# Patient Record
Sex: Female | Born: 1960 | Race: Black or African American | Hispanic: No | Marital: Married | Smoking: Never smoker
Health system: Southern US, Community
[De-identification: ages and names within clinical notes are randomized; demographics above are authoritative.]

## PROBLEM LIST (undated history)

## (undated) DIAGNOSIS — I10 Essential (primary) hypertension: Secondary | ICD-10-CM

## (undated) HISTORY — DX: Essential (primary) hypertension: I10

## (undated) HISTORY — PX: TUBAL LIGATION: SHX77

---

## 2013-09-24 ENCOUNTER — Encounter (INDEPENDENT_AMBULATORY_CARE_PROVIDER_SITE_OTHER): Payer: Self-pay | Admitting: Obstetrics & Gynecology

## 2013-10-01 ENCOUNTER — Encounter: Payer: Self-pay | Admitting: Obstetrics & Gynecology

## 2013-10-01 ENCOUNTER — Ambulatory Visit (INDEPENDENT_AMBULATORY_CARE_PROVIDER_SITE_OTHER): Payer: Self-pay | Admitting: Obstetrics & Gynecology

## 2013-10-01 VITALS — BP 145/90 | HR 85 | Temp 97.0°F | Ht 65.0 in | Wt 265.0 lb

## 2013-10-01 DIAGNOSIS — L910 Hypertrophic scar: Secondary | ICD-10-CM

## 2013-10-01 DIAGNOSIS — Z01419 Encounter for gynecological examination (general) (routine) without abnormal findings: Secondary | ICD-10-CM

## 2013-10-01 NOTE — Progress Notes (Signed)
Patient ID: Kristine Kennedy, female   DOB: March 29, 1961, 52 y.o.   MRN: [id number] Subjective:     Kristine Kennedy is a 52 y.o.[id number] female here for a routine exam.  Current complaints: painful lesion on left breast and left side of back at bra line.  Pt reports that the lesion on her breast began as a pimple.  She reports that it is painful she does note that her bra rubs this area and makes the sx worse.  She has other lesions like this on other areas of her body but, they are not as large or painful.         Gynecologic History Patient's last menstrual period was 08/23/2013. Contraception: tubal ligation Last Pap: >20years prev. Results were: normal Last mammogram: unsure if she ever had one.   Obstetric History OB History  Gravida Para Term Preterm AB SAB TAB Ectopic Multiple Living  5 5 5  0 0 0 0 0 0 5    # Outcome Date GA Lbr Len/2nd Weight Sex Delivery Anes PTL Lv  5 TRM      SVD     4 TRM      SVD     3 TRM      SVD     2 TRM      SVD     1 TRM      SVD          The following portions of the patient's history were reviewed and updated as appropriate: allergies, current medications, past family history, past medical history, past social history, past surgical history and problem list.  Review of Systems Pertinent items are noted in HPI.    Objective:    BP 145/90  Pulse 85  Temp(Src) 97 F (36.1 C) (Oral)  Ht 5\' 5"  (1.651 m)  Wt 265 lb (120.203 kg)  BMI 44.1 kg/m2  LMP 08/23/2013  General Appearance:    Alert, cooperative, no distress, appears stated age  Head:    Normocephalic, without obvious abnormality, atraumatic              Neck:   Supple, symmetrical, trachea midline, no adenopathy;    thyroid:  no enlargement/tenderness/nodules; no carotid   bruit or JVD  Back:     Symmetric, no curvature, ROM normal, no CVA tenderness  Lungs:     Clear to auscultation bilaterally, respirations unlabored  Chest Wall:    No tenderness or deformity   Heart:    Regular rate and rhythm, S1 and S2 normal, no murmur, rub   or gallop  Breast Exam:    No tenderness, masses, or nipple abnormality on left breast upper outer quad there is a 5x2 cm keloiod that is very tender topalpation  Abdomen:     Soft, non-tender, bowel sounds active all four quadrants,    no masses, no organomegaly  Genitalia:    Normal female without lesion, discharge or tenderness; EGBUS: no lesions; cervix: no CMT; uterus: small mobile;  Adnexa without masses     Extremities:   Extremities normal, atraumatic, no cyanosis or edema  Pulses:   2+ and symmetric all extremities  Skin:   There is a pedunculated keloid on the back left side at the bra line- the base is ~2-3cm  Lymph nodes:   Cervical, supraclavicular, and axillary nodes normal         Assessment:    Healthy female exam. stressed to pt the importance of routine breast and cervical cancer screening  Keloids x2 (breast and back on left side)   Plan:    Mammogram ordered.   F/u 1 year F/u PAP referral to [facility] clinic to manage keloid (if they are unable to manage pt will call [facility]YN [facility] and I will have Kenalog ordered to inject)

## 2013-10-01 NOTE — Patient Instructions (Addendum)
Mammogram A mammogram is an X-ray test to find changes in a woman's breast. You should get a mammogram if:  You are over 53 years of age.   You have risk factors.   Your doctor recommends that you have one.  BEFORE THE TEST  Do not schedule the test the week before your period, especially if your breasts are sore during this time.  On the day of your mammogram:  Wash your breasts and armpits well. After washing, do not put on any deodorant or talcum powder on until after your test.   Eat and drink as you usually do.   Take your medicines as usual.   If you are diabetic and take insulin, make sure you:   Eat before coming for your test.   Take your insulin as usual.   If you cannot keep your appointment, call before the appointment to cancel. Schedule another appointment.  TEST  You will need to undress from the waist up. You will put on a hospital gown.   Your breast will be put on the mammogram machine, and it will press firmly on your breast with a piece of plastic called a compression paddle. This will make your breast flatter so that the machine can X-ray all parts of your breast.   Both breasts will be X-rayed. Each breast will be X-rayed from above and from the side. An X-ray might need to be taken again if the picture is not good enough.   The mammogram will last about 15 to 30 minutes.  AFTER THE TEST Finding out the results of your test Ask when your test results will be ready. Make sure you get your test results. Document Released: 03/14/2009 Document Revised: 12/05/2011 Document Reviewed: 03/14/2009 [facility] Patient Information 2012 [place], [place].Mammogram Tips Healthy women should begin getting mammograms every year or two once they reach age 38, and once a year when they reach age 3. Here are tips:  Find an experienced, high-volume center with accomplished radiologists. You can ask for their credentials.  Ask to see the certificate showing the center is  approved by the U.S. Food and Drug Administration.  Use the same center regularly, so it is easier to compare your new mammograms with your old ones.  Bring a list of places you have had mammograms, dates, biopsies or other breast treatments. Bring old mammograms with you or have them sent to your primary caregiver.  Describe any breast problems to your caregiver or the person doing the mammogram. Be ready to give past surgeries, birth control pills, hormone use, breast implants, growths, moles, breast scars and family or personal history of breast cancer.  Call your doctor or center to check on the mammogram if you hear nothing within 10 days. Do not assume everything was normal.  To protect your privacy, the mammogram results cannot be given over the phone or to anyone but you.  Radiation from a mammogram is very low and does not pose a radiation risk.  Mammograms can detect breast problems other than breast cancer.  You may be asked stand or sit in front of the X-ray machine.  Two small plastic or glass plates are placed around the breast when taking the X-ray.  If you are menstruating, schedule your mammogram a week after your menstrual period.  Do not wear deodorants, powder or perfume when getting a mammogram.  Wash your breasts and under your arms before getting a mammogram.  Wear cloths that are easy for you to  undress and dress.  Arrive at the center at least 15 minutes before the mammogram is scheduled.  There may be slight discomfort during the mammogram, but it goes away shortly after the test.  Try to relax as much as possible during the mammogram.  Talk to your caregiver if you do not understand the results of the mammogram.  Follow the recommendations of your caregiver regarding further tests and treatments if needed.  Get a second opinion if you are concerned or question the results of the mammogram, further tests or treatment if needed.  Continue with monthly  self-breast exams and yearly caregiver exams even if the mammogram is normal.  Your caregiver may recommend getting a mammogram before age 17 and more often if you are at high risk for developing breast cancer. Document Released: 04/03/2006 Document Revised: 03/09/2012 Document Reviewed: 12/09/2008 [facility] Patient Information 2014 [place], [place].

## 2013-10-01 NOTE — Progress Notes (Signed)
Mammogram scholarship application completed.  Will fax to radiology.  Referral form for [facility] completed.  Will fax to [facility].  New patient packet given to patient.  Instructed patient to complete and mail to [facility] in envelope provided ASAP.  Will call patient once appointment is scheduled. Patient states understanding.

## 2013-10-18 ENCOUNTER — Ambulatory Visit: Payer: Self-pay

## 2013-10-22 ENCOUNTER — Ambulatory Visit: Payer: Self-pay

## 2013-10-22 ENCOUNTER — Encounter: Payer: Self-pay | Admitting: Family Medicine

## 2013-10-22 ENCOUNTER — Ambulatory Visit (INDEPENDENT_AMBULATORY_CARE_PROVIDER_SITE_OTHER): Payer: Self-pay | Admitting: Family Medicine

## 2013-10-22 VITALS — BP 153/98 | HR 97 | Temp 98.3°F | Ht 66.0 in | Wt 265.0 lb

## 2013-10-22 DIAGNOSIS — L910 Hypertrophic scar: Secondary | ICD-10-CM | POA: Insufficient documentation

## 2013-10-22 DIAGNOSIS — I10 Essential (primary) hypertension: Secondary | ICD-10-CM

## 2013-10-22 LAB — BASIC METABOLIC PANEL
BUN: 10 mg/dL (ref 6–23)
CO2: 27 mEq/L (ref 19–32)
Calcium: 9 mg/dL (ref 8.4–10.5)
Creat: 0.64 mg/dL (ref 0.50–1.10)
Glucose, Bld: 72 mg/dL (ref 70–99)

## 2013-10-22 LAB — LIPID PANEL
Cholesterol: 140 mg/dL (ref 0–200)
LDL Cholesterol: 80 mg/dL (ref 0–99)
Total CHOL/HDL Ratio: 2.7 Ratio

## 2013-10-22 MED ORDER — HYDROCHLOROTHIAZIDE 25 MG PO TABS: 25.0000 mg | ORAL_TABLET | Freq: Every day | ORAL | Status: DC

## 2013-10-22 NOTE — Progress Notes (Signed)
  Subjective:    Patient ID: Kristine Kennedy, female    DOB: 05-07-1961, 52 y.o.   MRN: [id number]  HPI Patient complaining of skin growths on her breast and back. They started out looking like a "white-head" and have been gradually enlarging over the past 2 years. There is a sharp stabbing pain in them when they are touched or sometimes spontaneously. She has tried to pop them but that did not help and was painful. She had one similar lesion that was drained and packed on her thigh many years ago. It has scarred over but not formed a large growth as these two have. She did not get scars from having her ears peirced. She does not report easily scarring otherwise but says she hasn't had many injuries to judge.   Review of Systems  Constitutional: Negative for fever and unexpected weight change.  HENT: Negative for hearing loss, mouth sores, tinnitus, trouble swallowing and voice change.   Eyes: Positive for visual disturbance.  Respiratory: Negative for cough and shortness of breath.   Cardiovascular: Negative for chest pain, palpitations and leg swelling.  Gastrointestinal: Negative for nausea, vomiting, abdominal pain, diarrhea, constipation and blood in stool.  Endocrine: Negative for polydipsia and polyuria.  Genitourinary: Negative for dysuria and hematuria.  Skin: Negative for rash and wound.       2 growths and 2 other dark scars  Neurological: Negative for dizziness, syncope, weakness, numbness and headaches.  Psychiatric/Behavioral: Negative for dysphoric mood. The patient is not nervous/anxious.   All other systems reviewed and are negative.       Objective:   Physical Exam  Nursing note and vitals reviewed. Constitutional: She is oriented to person, place, and time. She appears well-developed and well-nourished. No distress.  HENT:  Head: Normocephalic and atraumatic.  Right Ear: External ear normal.  Left Ear: External ear normal.  Nose: Nose normal.  Eyes:  Conjunctivae and EOM are normal. Right eye exhibits no discharge. Left eye exhibits no discharge. No scleral icterus.  Cardiovascular: Normal rate, regular rhythm, normal heart sounds and intact distal pulses.   No murmur heard. Pulmonary/Chest: Effort normal and breath sounds normal. She has no wheezes. She has no rales.  Abdominal: Soft. She exhibits no distension. There is no tenderness.  Musculoskeletal: She exhibits no edema.  Neurological: She is alert and oriented to person, place, and time. Coordination normal.  Skin: Skin is warm and dry. She is not diaphoretic. No cyanosis. Nails show no clubbing.     Psychiatric: She has a normal mood and affect. Her behavior is normal. Judgment and thought content normal.          Assessment & Plan:  See problem list

## 2013-10-22 NOTE — Assessment & Plan Note (Signed)
Patient complaining of 2 years of enlarging growths around bra line, 1 on L breast (~2x6cm)and 1 on back (~3x3cm), look like keloids (started as cyst and grow with irritation), painful and tender - advised patient that excision often results in return of even larger lesion - options given were intralesional steroid injections, serially in office or dermatology referral for likely excision with steroids - patient will think about options and make appointment for steroid injection if she decides to go that way

## 2013-10-22 NOTE — Patient Instructions (Signed)
Thank you for coming in today. I am sorry we do not have a great treatment to fix your keloid quickly. I do think that the safest and most effective thing would be to have steroid injections or to see a dermatologist for more definitive therapy.   For your high blood pressure, I would like to start a medication called  Hydrochlorothiazide. It has few side effects but sometimes can make people urinate more often or feel dizzy. These usually go away after the first few weeks. I would like to see you back next month to see how this is going. We will also check some lab work today to see how your cholesterol, blood sugar, and kidney function are doing. If there is anything wrong we will call you, otherwise I will send a letter.  Thank you!  Dr. Richarda Blade

## 2013-10-22 NOTE — Assessment & Plan Note (Signed)
New diagnosis today, multiple blood pressure readings in the 140s and 150s systolic, 2 documented here, many more measured at patient's work-place - check bmet - start hctz 25 - check lipid panel and A1c - follow up in 1 month for recheck and to assess side effects

## 2013-10-23 ENCOUNTER — Encounter: Payer: Self-pay | Admitting: Family Medicine

## 2013-11-19 ENCOUNTER — Encounter: Payer: Self-pay | Admitting: Family Medicine

## 2013-11-19 ENCOUNTER — Ambulatory Visit (INDEPENDENT_AMBULATORY_CARE_PROVIDER_SITE_OTHER): Payer: Self-pay | Admitting: Family Medicine

## 2013-11-19 VITALS — BP 140/82 | HR 86 | Temp 99.4°F | Ht 66.0 in | Wt 271.0 lb

## 2013-11-19 DIAGNOSIS — Z23 Encounter for immunization: Secondary | ICD-10-CM

## 2013-11-19 DIAGNOSIS — I10 Essential (primary) hypertension: Secondary | ICD-10-CM

## 2013-11-19 DIAGNOSIS — Z0000 Encounter for general adult medical examination without abnormal findings: Secondary | ICD-10-CM | POA: Insufficient documentation

## 2013-11-19 MED ORDER — HYDROCHLOROTHIAZIDE 50 MG PO TABS: 50.0000 mg | ORAL_TABLET | Freq: Every day | ORAL | Status: DC

## 2013-11-19 NOTE — Assessment & Plan Note (Signed)
Pt unsure of last Tdap, not recent - declines flu shot - give tdap today - give info for colon ca screening and mammogram

## 2013-11-19 NOTE — Assessment & Plan Note (Signed)
Improved control, 140/82 here but up to 160 at patients work - increase HCTZ to 50mg  daily - patient to continue home checks and return in 1 month if values remain >140, otherwise f/u in 6 months

## 2013-11-19 NOTE — Progress Notes (Signed)
  Subjective:    Patient ID: Kristine Kennedy, female    DOB: [birth year], 52 y.o.   MRN: [id number]  Hypertension Associated symptoms include headaches. Pertinent negatives include no chest pain or shortness of breath.   CHRONIC HYPERTENSION  Disease Monitoring  Blood pressure range: 140-160/80-90  Chest pain: no   Dyspnea: no   Claudication: no   Medication compliance: yes  Medication Side Effects  Lightheadedness: no   Urinary frequency: no   Edema: no   Impotence: no   Preventitive Healthcare:  Exercise: no   Diet Pattern: increased veggies  Salt Restriction: sometimes     Review of Systems  Respiratory: Negative for shortness of breath.   Cardiovascular: Negative for chest pain and leg swelling.  Genitourinary: Negative for urgency and frequency.  Neurological: Positive for headaches.  All other systems reviewed and are negative.       Objective:   Physical Exam  Nursing note and vitals reviewed. Constitutional: She is oriented to person, place, and time. She appears well-developed and well-nourished. No distress.  HENT:  Head: Normocephalic and atraumatic.  Right Ear: External ear normal.  Left Ear: External ear normal.  Nose: Nose normal.  Eyes: Conjunctivae are normal. Right eye exhibits no discharge. Left eye exhibits no discharge. No scleral icterus.  Cardiovascular: Normal rate, regular rhythm, normal heart sounds and intact distal pulses.   No murmur heard. Pulmonary/Chest: Effort normal and breath sounds normal. No respiratory distress. She has no wheezes.  Abdominal: Soft. She exhibits no distension.  Neurological: She is alert and oriented to person, place, and time.  Skin: Skin is warm and dry. No rash noted. She is not diaphoretic.  Psychiatric: She has a normal mood and affect. Her behavior is normal.          Assessment & Plan:

## 2013-11-19 NOTE — Patient Instructions (Signed)
Thank you for coming in today. I am increasing your blood pressure medication to improve out control of your hypertension. Please keep taking your blood pressure at work and if you keep running high come back to see me next month, otherwise come back in 6 months or whenever you decide you want to have your mass injected.  Thank you,  Dr. Richarda Blade  Hypertension Hypertension is another name for high blood pressure. High blood pressure may mean that your heart needs to work harder to pump blood. Blood pressure consists of two numbers, which includes a higher number over a lower number (example: 110/72). HOME CARE   Make lifestyle changes as told by your doctor. This may include weight loss and exercise.  Take your blood pressure medicine every day.  Limit how much salt you use.  Stop smoking if you smoke.  Do not use drugs.  Talk to your doctor if you are using decongestants or birth control pills. These medicines might make blood pressure higher.  Females should not drink more than 1 alcoholic drink per day. Males should not drink more than 2 alcoholic drinks per day.  See your doctor as told. GET HELP RIGHT AWAY IF:   You have a blood pressure reading with a top number of 180 or higher.  You get a very bad headache.  You get blurred or changing vision.  You feel confused.  You feel weak, numb, or faint.  You get chest or belly (abdominal) pain.  You throw up (vomit).  You cannot breathe very well. MAKE SURE YOU:   Understand these instructions.  Will watch your condition.  Will get help right away if you are not doing well or get worse. Document Released: 06/03/2008 Document Revised: 03/09/2012 Document Reviewed: 06/03/2008 [facility] Patient Information 2014 [place], [place].

## 2014-01-21 ENCOUNTER — Encounter: Payer: Self-pay | Admitting: Obstetrics & Gynecology

## 2014-01-21 ENCOUNTER — Ambulatory Visit (INDEPENDENT_AMBULATORY_CARE_PROVIDER_SITE_OTHER): Payer: Self-pay | Admitting: Obstetrics & Gynecology

## 2014-01-21 ENCOUNTER — Other Ambulatory Visit (HOSPITAL_COMMUNITY)
Admission: RE | Admit: 2014-01-21 | Discharge: 2014-01-21 | Disposition: A | Discharge status: Home / Self Care | Payer: Self-pay | Source: Ambulatory Visit | Attending: Obstetrics & Gynecology | Admitting: Obstetrics & Gynecology

## 2014-01-21 VITALS — BP 120/84 | HR 89 | Temp 97.8°F | Ht 66.0 in | Wt 263.8 lb

## 2014-01-21 DIAGNOSIS — L910 Hypertrophic scar: Secondary | ICD-10-CM

## 2014-01-21 NOTE — Patient Instructions (Signed)
Scar Minimization  You will have a scar anytime you have surgery and a cut is made in the skin or you have something removed from your skin (mole, skin cancer, cyst). Although scars are unavoidable following surgery, there are ways to minimize their appearance.  It is important to follow all the instructions you receive from your caregiver about wound care. How your wound heals will influence the appearance of your scar. If you do not follow the wound care instructions as directed, complications such as infection may occur. Wound instructions include keeping the wound clean, moist, and not letting the wound form a scab. Some people form scars that are raised and lumpy (hypertrophic) or larger than the initial wound (keloidal).  HOME CARE INSTRUCTIONS   · Follow wound care instructions as directed.  · Keep the wound clean by washing it with soap and water.  · Keep the wound moist with provided antibiotic cream or petroleum jelly until completely healed. Moisten twice a day for about 2 weeks.  · Get stitches (sutures) taken out at the scheduled time.  · Avoid touching or manipulating your wound unless needed. Wash your hands thoroughly before and after touching your wound.  · Follow all restrictions such as limits on exercise or work. This depends on where your scar is located.  · Keep the scar protected from sunburn. Cover the scar with sunscreen/sunblock with SPF 30 or higher.  · Gently massage the scar using a circular motion to help minimize the appearance of the scar. Do this only after the wound has closed and all the sutures have been removed.  · For hypertrophic or keloidal scars, there are several ways to treat and minimize their appearance. Methods include compression therapy, intralesional corticosteroids, laser therapy, or surgery. These methods are performed by your caregiver.  Remember that the scar may appear lighter or darker than your normal skin color. This difference in color should even out with  time.  SEEK MEDICAL CARE IF:   · You have a fever.  · You develop signs of infection such as pain, redness, pus, and warmth.  · You have questions or concerns.  Document Released: 06/05/2010 Document Revised: 03/09/2012 Document Reviewed: 06/05/2010  [employer]® Patient Information ©2014 [employer].

## 2014-01-21 NOTE — Progress Notes (Signed)
Pt took does of blood pressure medications in office.

## 2014-01-21 NOTE — Progress Notes (Signed)
Patient ID: Kristine ShutterDesiree Kennedy, female   DOB: 01/18/1961, 53 y.o.   MRN: [id number] The indications and risks/complications of keloid excision were reviewed.   Risks of the excision including pain, bleeding, infection, further scarring including recurrent keloid formation and need for additional procedures  were discussed. The patient is aware that she will need to followup for weekly Kenalog injections to prevent reoccurrence until the scar is healed and even then the keloid may return.  The patient stated understanding and agreed to undergo procedure today. Consent was signed,  time out performed.  The patient's left upper chest and left upper side under her arm  were cleaned with Betadine. 2% lidocaine was injected into the area.  An elliptical incision was made around each lesion.  The  biopsy tissue was picked up with sterile forceps and sterile scissors were used to excise the lesion.  the lesion was reapproximated with 3-0 vicryl extra in interrupted sutures.  The patient tolerated the procedure well. Post-procedure instructions  were given to the patient. The patient is to call for bleeding, fever greater than 100.4,  or other concerns. The patient will be return to clinic in 1 week for Kenalog injection.  [name] L. Harraway-Smith, M.D., [facility]AC[facility]

## 2014-01-27 ENCOUNTER — Encounter: Payer: Self-pay | Admitting: *Deleted

## 2014-01-28 ENCOUNTER — Encounter: Payer: Self-pay | Admitting: Obstetrics & Gynecology

## 2014-01-28 ENCOUNTER — Ambulatory Visit (INDEPENDENT_AMBULATORY_CARE_PROVIDER_SITE_OTHER): Payer: Self-pay | Admitting: Obstetrics & Gynecology

## 2014-01-28 VITALS — BP 125/86 | HR 78 | Temp 98.6°F | Ht 65.0 in | Wt 266.4 lb

## 2014-01-28 DIAGNOSIS — L910 Hypertrophic scar: Secondary | ICD-10-CM

## 2014-01-28 NOTE — Progress Notes (Signed)
Subjective:     Patient ID: Kristine Kennedy, female   DOB: 01/15/1961, 53 y.o.   MRN: [id number]  HPI Pt here for injection of Keloids.  She is not having any problems.      Review of Systems     Objective:   Physical Exam BP 125/86  Pulse 78  Temp(Src) 98.6 F (37 C) (Oral)  Ht 5\' 5"  (1.651 m)  Wt 266 lb 6.4 oz (120.838 kg)  BMI 44.33 kg/m2  LMP 12/26/2013  Keloids of left side and back injected with Kenalog 40- 4cc mixed with 1 cc of 2% lidocaine.  Incisions are clean and dry and the sutures are still in place       Assessment:     keloid of skin- s/p resection     Plan:     F/u in 1 week for Kenalog injection

## 2014-02-04 ENCOUNTER — Encounter: Payer: Self-pay | Admitting: Obstetrics & Gynecology

## 2014-02-04 ENCOUNTER — Ambulatory Visit (INDEPENDENT_AMBULATORY_CARE_PROVIDER_SITE_OTHER): Payer: Self-pay | Admitting: Obstetrics & Gynecology

## 2014-02-04 VITALS — BP 121/81 | HR 86 | Temp 97.6°F | Ht 65.0 in | Wt 262.5 lb

## 2014-02-04 DIAGNOSIS — L910 Hypertrophic scar: Secondary | ICD-10-CM

## 2014-02-04 NOTE — Patient Instructions (Signed)
Keep incision sites clean and dry.  Follow up in 3 weeks for injection of Kenalog

## 2014-02-04 NOTE — Progress Notes (Signed)
Patient ID: Kristine Kennedy, female   DOB: 11/06/1961, 53 y.o.   MRN: [id number] HPI Pt here for injection of Keloids. She is not having any problems.  Review of Systems  Objective:   Physical Exam BP 121/81  Pulse 86  Temp(Src) 97.6 F (36.4 C) (Oral)  Ht 5\' 5"  (1.651 m)  Wt 262 lb 8 oz (119.069 kg)  BMI 43.68 kg/m2  LMP 12/26/2013 Pts sutures were removed  Keloids of left side and back injected with Kenalog 40- 4cc mixed with 1 cc of 2% lidocaine. 6 cc total Incisions are clean and dry.  Benzoin and Steristrips applied  Assessment:   keloid of skin  Plan:   F/u in 3 week for Kenalog injection

## 2014-02-25 ENCOUNTER — Ambulatory Visit: Payer: Self-pay | Admitting: Obstetrics & Gynecology

## 2014-03-17 ENCOUNTER — Encounter: Payer: Self-pay | Admitting: Obstetrics & Gynecology

## 2014-03-17 ENCOUNTER — Ambulatory Visit (INDEPENDENT_AMBULATORY_CARE_PROVIDER_SITE_OTHER): Payer: Self-pay | Admitting: Obstetrics & Gynecology

## 2014-03-17 VITALS — BP 133/88 | HR 107 | Temp 98.7°F | Ht 65.0 in | Wt 265.9 lb

## 2014-03-17 DIAGNOSIS — L910 Hypertrophic scar: Secondary | ICD-10-CM

## 2014-03-17 MED ORDER — TRIAMCINOLONE ACETONIDE 40 MG/ML IJ SUSP
Freq: Once | INTRAMUSCULAR | Status: AC
  Administered 2014-03-17: 16:00:00 via SUBCUTANEOUS

## 2014-03-17 NOTE — Progress Notes (Signed)
Patient ID: Kristine ShutterDesiree Sutton-Brinkley, female   DOB: 03/12/1961, 53 y.o.   MRN: [id number] Pt seen for keloid on left side and back.  Area cleaned with alcohol.  2cc of Kenalog 40 + 1 cc of 1 %Lidocaine was injected into the keloids.  There were no complications. Pt instructed about s/sx of infection.  She tolerated the procedure well.  She will f/u in 2 weeks for repeat injection.  She had a pic on her phone of her perianal area.  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  She reports that it has been present for 1 months.  She will have it eval on her next visit.  Yerik Zeringue L. Harraway-Smith, M.D., [facility]

## 2014-03-17 NOTE — Addendum Note (Signed)
Addended by: Candelaria StagersHAIZLIP, Adilynne Fitzwater E on: 03/17/2014 03:49 PM   Modules accepted: Orders

## 2014-03-17 NOTE — Patient Instructions (Signed)
Genital Warts Genital warts are a sexually transmitted infection. They may appear as small bumps on the tissues of the genital area. CAUSES  Genital warts are caused by a virus called human papillomavirus (HPV). HPV is the most common sexually transmitted disease (STD) and infection of the sex organs. This infection is spread by having unprotected sex with an infected person. It can be spread by vaginal, anal, and oral sex. Many people do not know they are infected. They may be infected for years without problems. However, even if they do not have problems, they can unknowingly pass the infection to their sexual partners. SYMPTOMS   Itching and irritation in the genital area.  Warts that bleed.  Painful sexual intercourse. DIAGNOSIS  Warts are usually recognized with the naked eye on the vagina, vulva, perineum, anus, and rectum. Certain tests can also diagnose genital warts, such as:  A Pap test.  A tissue sample (biopsy) exam.  Colposcopy. A magnifying tool is used to examine the vagina and cervix. The HPV cells will change color when certain solutions are used. TREATMENT  Warts can be removed by:  Applying certain chemicals, such as cantharidin or podophyllin.  Liquid nitrogen freezing (cryotherapy).  Immunotherapy with candida or trichophyton injections.  Laser treatment.  Burning with an electrified probe (electrocautery).  Interferon injections.  Surgery. PREVENTION  HPV vaccination can help prevent HPV infections that cause genital warts and that cause cancer of the cervix. It is recommended that the vaccination be given to people between the ages 9 to 26 years old. The vaccine might not work as well or might not work at all if you already have HPV. It should not be given to pregnant women. HOME CARE INSTRUCTIONS   It is important to follow your caregiver's instructions. The warts will not go away without treatment. Repeat treatments are often needed to get rid of warts.  Even after it appears that the warts are gone, the normal tissue underneath often remains infected.  Do not try to treat genital warts with medicine used to treat hand warts. This type of medicine is strong and can burn the skin in the genital area, causing more damage.  Tell your past and current sexual partner(s) that you have genital warts. They may be infected also and need treatment.  Avoid sexual contact while being treated.  Do not touch or scratch the warts. The infection may spread to other parts of your body.  Women with genital warts should have a cervical cancer check (Pap test) at least once a year. This type of cancer is slow-growing and can be cured if found early. Chances of developing cervical cancer are increased with HPV.  Inform your obstetrician about your warts in the event of pregnancy. This virus can be passed to the baby's respiratory tract. Discuss this with your caregiver.  Use a condom during sexual intercourse. Following treatment, the use of condoms will help prevent reinfection.  Ask your caregiver about using over-the-counter anti-itch creams. SEEK MEDICAL CARE IF:   Your treated skin becomes red, swollen, or painful.  You have a fever.  You feel generally ill.  You feel little lumps in and around your genital area.  You are bleeding or have painful sexual intercourse. MAKE SURE YOU:   Understand these instructions.  Will watch your condition.  Will get help right away if you are not doing well or get worse. Document Released: 12/13/2000 Document Revised: 03/09/2012 Document Reviewed: 06/24/2011 [employer] Patient Information 2014 [employer].  

## 2014-03-30 ENCOUNTER — Ambulatory Visit (INDEPENDENT_AMBULATORY_CARE_PROVIDER_SITE_OTHER): Payer: Self-pay | Admitting: Obstetrics & Gynecology

## 2014-03-30 ENCOUNTER — Encounter: Payer: Self-pay | Admitting: Obstetrics & Gynecology

## 2014-03-30 VITALS — BP 139/91 | HR 92 | Temp 98.0°F | Ht 65.0 in | Wt 264.5 lb

## 2014-03-30 DIAGNOSIS — L910 Hypertrophic scar: Secondary | ICD-10-CM

## 2014-03-30 NOTE — Patient Instructions (Signed)
Genital Warts Genital warts are a sexually transmitted infection. They may appear as small bumps on the tissues of the genital area. CAUSES  Genital warts are caused by a virus called human papillomavirus (HPV). HPV is the most common sexually transmitted disease (STD) and infection of the sex organs. This infection is spread by having unprotected sex with an infected person. It can be spread by vaginal, anal, and oral sex. Many people do not know they are infected. They may be infected for years without problems. However, even if they do not have problems, they can unknowingly pass the infection to their sexual partners. SYMPTOMS   Itching and irritation in the genital area.  Warts that bleed.  Painful sexual intercourse. DIAGNOSIS  Warts are usually recognized with the naked eye on the vagina, vulva, perineum, anus, and rectum. Certain tests can also diagnose genital warts, such as:  A Pap test.  A tissue sample (biopsy) exam.  Colposcopy. A magnifying tool is used to examine the vagina and cervix. The HPV cells will change color when certain solutions are used. TREATMENT  Warts can be removed by:  Applying certain chemicals, such as cantharidin or podophyllin.  Liquid nitrogen freezing (cryotherapy).  Immunotherapy with candida or trichophyton injections.  Laser treatment.  Burning with an electrified probe (electrocautery).  Interferon injections.  Surgery. PREVENTION  HPV vaccination can help prevent HPV infections that cause genital warts and that cause cancer of the cervix. It is recommended that the vaccination be given to people between the ages 9 to 26 years old. The vaccine might not work as well or might not work at all if you already have HPV. It should not be given to pregnant women. HOME CARE INSTRUCTIONS   It is important to follow your caregiver's instructions. The warts will not go away without treatment. Repeat treatments are often needed to get rid of warts.  Even after it appears that the warts are gone, the normal tissue underneath often remains infected.  Do not try to treat genital warts with medicine used to treat hand warts. This type of medicine is strong and can burn the skin in the genital area, causing more damage.  Tell your past and current sexual partner(s) that you have genital warts. They may be infected also and need treatment.  Avoid sexual contact while being treated.  Do not touch or scratch the warts. The infection may spread to other parts of your body.  Women with genital warts should have a cervical cancer check (Pap test) at least once a year. This type of cancer is slow-growing and can be cured if found early. Chances of developing cervical cancer are increased with HPV.  Inform your obstetrician about your warts in the event of pregnancy. This virus can be passed to the baby's respiratory tract. Discuss this with your caregiver.  Use a condom during sexual intercourse. Following treatment, the use of condoms will help prevent reinfection.  Ask your caregiver about using over-the-counter anti-itch creams. SEEK MEDICAL CARE IF:   Your treated skin becomes red, swollen, or painful.  You have a fever.  You feel generally ill.  You feel little lumps in and around your genital area.  You are bleeding or have painful sexual intercourse. MAKE SURE YOU:   Understand these instructions.  Will watch your condition.  Will get help right away if you are not doing well or get worse. Document Released: 12/13/2000 Document Revised: 03/09/2012 Document Reviewed: 06/24/2011 [employer] Patient Information 2014 [employer].  

## 2014-03-30 NOTE — Progress Notes (Signed)
Subjective:     Patient ID: Kristine Kennedy, female   DOB: 04/14/1961, 53 y.o.   MRN: [id number]  HPI Pt with no problem presents for eval of keloid for possible injection of steroid.   Review of Systems     Objective:   Physical Exam BP 139/91  Pulse 92  Temp(Src) 98 F (36.7 C) (Oral)  Ht 5\' 5"  (1.651 m)  Wt 264 lb 8 oz (119.976 kg)  BMI 44.01 kg/m2  LMP 03/03/2014 Both scars/incisions evaluated- no keloid activity noted There is a small ? Blood clots on the left back lesion (~.3cm) in size.      Assessment:     Keloids- clinically improved.  No injection needed at present  Genital warts noted on last visit- pt want to hold treatment for now and wait for spontaneous resolution      Plan:     F/u in  2weeks unless scar still smooth.  If no further keloid.  Pt will cancel

## 2014-04-13 ENCOUNTER — Ambulatory Visit: Payer: Self-pay | Admitting: Obstetrics & Gynecology

## 2014-10-31 ENCOUNTER — Encounter: Payer: Self-pay | Admitting: Obstetrics & Gynecology

## 2014-11-29 ENCOUNTER — Other Ambulatory Visit: Payer: Self-pay | Admitting: *Deleted

## 2014-11-29 MED ORDER — ASPIRIN-ACETAMINOPHEN 500-325 MG PO PACK
1.0000 | PACK | ORAL | Status: DC | PRN
Start: 2014-11-29 — End: 2016-02-09

## 2014-11-29 NOTE — Telephone Encounter (Signed)
Pt out of medication.  Kristine Bundrick L, RN  

## 2014-12-04 ENCOUNTER — Other Ambulatory Visit: Payer: Self-pay | Admitting: Family Medicine

## 2014-12-05 ENCOUNTER — Other Ambulatory Visit: Payer: Self-pay | Admitting: *Deleted

## 2014-12-05 MED ORDER — HYDROCHLOROTHIAZIDE 50 MG PO TABS: 50.0000 mg | ORAL_TABLET | Freq: Every day | ORAL | Status: DC

## 2015-03-14 DIAGNOSIS — R52 Pain, unspecified: Secondary | ICD-10-CM | POA: Insufficient documentation

## 2015-03-14 DIAGNOSIS — R630 Anorexia: Secondary | ICD-10-CM | POA: Insufficient documentation

## 2015-03-14 DIAGNOSIS — J029 Acute pharyngitis, unspecified: Secondary | ICD-10-CM | POA: Insufficient documentation

## 2015-03-14 DIAGNOSIS — R0981 Nasal congestion: Secondary | ICD-10-CM | POA: Insufficient documentation

## 2015-03-14 DIAGNOSIS — Z79899 Other long term (current) drug therapy: Secondary | ICD-10-CM | POA: Insufficient documentation

## 2015-03-14 DIAGNOSIS — R509 Fever, unspecified: Secondary | ICD-10-CM | POA: Insufficient documentation

## 2015-03-15 ENCOUNTER — Encounter (HOSPITAL_COMMUNITY): Payer: Self-pay | Admitting: *Deleted

## 2015-03-15 ENCOUNTER — Emergency Department (HOSPITAL_COMMUNITY): Payer: Self-pay

## 2015-03-15 ENCOUNTER — Emergency Department (HOSPITAL_COMMUNITY)
Admission: EM | Admit: 2015-03-15 | Discharge: 2015-03-15 | Disposition: A | Discharge status: Home / Self Care | Payer: Self-pay | Attending: Emergency Medicine | Admitting: Emergency Medicine

## 2015-03-15 DIAGNOSIS — R6889 Other general symptoms and signs: Secondary | ICD-10-CM

## 2015-03-15 LAB — CBC WITH DIFFERENTIAL/PLATELET
BASOS PCT: 1 % (ref 0–1)
Basophils Absolute: 0.1 10*3/uL (ref 0.0–0.1)
EOS PCT: 5 % (ref 0–5)
Eosinophils Absolute: 0.4 10*3/uL (ref 0.0–0.7)
HCT: 28.9 % — ABNORMAL LOW (ref 36.0–46.0)
Hemoglobin: 9.1 g/dL — ABNORMAL LOW (ref 12.0–15.0)
LYMPHS ABS: 1.4 10*3/uL (ref 0.7–4.0)
Lymphocytes Relative: 17 % (ref 12–46)
MCH: 22.8 pg — ABNORMAL LOW (ref 26.0–34.0)
MCHC: 31.5 g/dL (ref 30.0–36.0)
MCV: 72.3 fL — AB (ref 78.0–100.0)
Monocytes Absolute: 0.7 10*3/uL (ref 0.1–1.0)
Monocytes Relative: 9 % (ref 3–12)
Neutro Abs: 5.6 10*3/uL (ref 1.7–7.7)
Neutrophils Relative %: 68 % (ref 43–77)
Platelets: 242 10*3/uL (ref 150–400)
RBC: 4 MIL/uL (ref 3.87–5.11)
RDW: 21.6 % — AB (ref 11.5–15.5)
WBC: 8.2 10*3/uL (ref 4.0–10.5)

## 2015-03-15 LAB — BASIC METABOLIC PANEL
Anion gap: 4 — ABNORMAL LOW (ref 5–15)
BUN: 13 mg/dL (ref 6–23)
CO2: 28 mmol/L (ref 19–32)
Calcium: 8.4 mg/dL (ref 8.4–10.5)
Chloride: 109 mmol/L (ref 96–112)
Creatinine, Ser: 1.08 mg/dL (ref 0.50–1.10)
GFR calc Af Amer: 67 mL/min — ABNORMAL LOW (ref >90)
GFR calc non Af Amer: 58 mL/min — ABNORMAL LOW (ref >90)
GLUCOSE: 91 mg/dL (ref 70–99)
POTASSIUM: 3.6 mmol/L (ref 3.5–5.1)
Sodium: 141 mmol/L (ref 135–145)

## 2015-03-15 MED ORDER — SODIUM CHLORIDE 0.9 % IV BOLUS (SEPSIS)
1000.0000 mL | Freq: Once | INTRAVENOUS | Status: AC
  Administered 2015-03-15: 1000 mL via INTRAVENOUS

## 2015-03-15 MED ORDER — HYDROCOD POLST-CHLORPHEN POLST 10-8 MG/5ML PO LQCR
5.0000 mL | Freq: Once | ORAL | Status: AC
  Administered 2015-03-15: 5 mL via ORAL
  Filled 2015-03-15: qty 5

## 2015-03-15 MED ORDER — KETOROLAC TROMETHAMINE 30 MG/ML IJ SOLN
30.0000 mg | Freq: Once | INTRAMUSCULAR | Status: AC
  Administered 2015-03-15: 30 mg via INTRAVENOUS
  Filled 2015-03-15: qty 1

## 2015-03-15 MED ORDER — HYDROCOD POLST-CHLORPHEN POLST 10-8 MG/5ML PO LQCR: 5.0000 mL | Freq: Two times a day (BID) | ORAL | Status: DC | PRN

## 2015-03-15 NOTE — ED Provider Notes (Signed)
CSN: [id number]     Arrival date & time 03/14/15  2327 History   First MD Initiated Contact with Patient 03/15/15 0259     Chief Complaint  Patient presents with  . URI  . Generalized Body Aches     (Consider location/radiation/quality/duration/timing/severity/associated sxs/prior Treatment) Patient is a 54 y.o. female presenting with URI. The history is provided by the patient. No language interpreter was used.  URI Presenting symptoms: congestion, cough, fever and sore throat   Severity:  Moderate Associated symptoms comment:  Cough, fever, body aches, congestion chest pain with cough x 3 days. No vomiting or diarrhea. No known sick contacts. Cough is minimally productive.    Past Medical History  Diagnosis Date  . Hypertension    Past Surgical History  Procedure Laterality Date  . Tubal ligation     Family History  Problem Relation Age of Onset  . Cancer Father     prostate  . Early death Father   . Cancer Daughter     ovarian  . Asthma Son    History  Substance Use Topics  . Smoking status: Never Smoker   . Smokeless tobacco: Never Used  . Alcohol Use: Yes     Comment: rarely   OB History    Gravida Para Term Preterm AB TAB SAB Ectopic Multiple Living   5 5 5  0 0 0 0 0 0 5     Review of Systems  Constitutional: Positive for fever and appetite change. Negative for chills.  HENT: Positive for congestion and sore throat. Negative for trouble swallowing.   Respiratory: Positive for cough.   Cardiovascular: Positive for chest pain.  Gastrointestinal: Negative.  Negative for nausea and vomiting.  Genitourinary: Negative.   Musculoskeletal: Negative.  Negative for neck stiffness.  Skin: Negative.  Negative for rash.  Neurological: Negative.  Negative for weakness.      Allergies  Review of patient's allergies indicates no known allergies.  Home Medications   Prior to Admission medications   Medication Sig Start Date End Date Taking? Authorizing Provider   Chlorphen-PE-Acetaminophen 4-10-325 MG TABS Take 1 tablet by mouth every 4 (four) hours as needed (cold symptoms).   Yes Historical Provider, MD  fluticasone (FLONASE) 50 MCG/ACT nasal spray Place 1 spray into both nostrils daily.   Yes Historical Provider, MD  guaiFENesin (MUCINEX) 600 MG 12 hr tablet Take 600 mg by mouth 2 (two) times daily as needed for cough.   Yes Historical Provider, MD  hydrochlorothiazide (HYDRODIURIL) 50 MG tablet Take 1 tablet (50 mg total) by mouth daily. 12/05/14  Yes Abram SanderElena M Adamo, MD  Aspirin-Acetaminophen (GOODY BODY PAIN) 500-325 MG PACK Take 1 Package by mouth as needed. Patient not taking: Reported on 03/15/2015 11/29/14   Abram SanderElena M Adamo, MD   BP 132/65 mmHg  Pulse 79  Temp(Src) 100.5 F (38.1 C) (Oral)  Resp 17  SpO2 98% Physical Exam  Constitutional: She is oriented to person, place, and time. She appears well-developed and well-nourished.  HENT:  Head: Normocephalic.  Mouth/Throat: Oropharynx is clear and moist.  Neck: Normal range of motion. Neck supple.  Cardiovascular: Normal rate and regular rhythm.   Pulmonary/Chest: Effort normal and breath sounds normal. She has no wheezes. She has no rales.  Actively coughing.  Abdominal: Soft. Bowel sounds are normal. There is no tenderness. There is no rebound and no guarding.  Musculoskeletal: Normal range of motion.  Neurological: She is alert and oriented to person, place, and time.  Skin: Skin  is warm and dry. No rash noted.  Psychiatric: She has a normal mood and affect.    ED Course  Procedures (including critical care time) Labs Review Labs Reviewed  CBC WITH DIFFERENTIAL/PLATELET - Abnormal; Notable for the following:    Hemoglobin 9.1 (*)    HCT 28.9 (*)    MCV 72.3 (*)    MCH 22.8 (*)    RDW 21.6 (*)    All other components within normal limits  BASIC METABOLIC PANEL - Abnormal; Notable for the following:    GFR calc non Af Amer 58 (*)    GFR calc Af Amer 67 (*)    Anion gap 4 (*)     All other components within normal limits    Imaging Review Dg Chest 2 View  03/15/2015   CLINICAL DATA:  Acute onset of cough, congestion, body aches and chest pain. Initial encounter.  EXAM: CHEST  2 VIEW  COMPARISON:  None.  FINDINGS: The lungs are well-aerated. Mild bibasilar opacity likely reflects atelectasis, given the lack of an opacity on the lateral view. There is no evidence of pleural effusion or pneumothorax.  The heart is borderline enlarged. No acute osseous abnormalities are seen.  IMPRESSION: Borderline cardiomegaly; mild bibasilar airspace opacity likely reflects atelectasis.   Electronically Signed   By: Roanna Raider M.D.   On: 03/15/2015 04:33     EKG Interpretation None      MDM   Final diagnoses:  None    1. Flu-like illness  No PNA on CXR. Symptoms supportive of influenza with negative labs otherwise. VSS. Medications improved symptoms of cough. Supportive care at home - PCP follow up.    Elpidio Anis, PA-C 03/15/15 1610  April Palumbo, MD 03/15/15 [phone]

## 2015-03-15 NOTE — Discharge Instructions (Signed)

## 2015-03-15 NOTE — ED Notes (Signed)
Pt states that she has had cough, congestion, body aches and pains for the last 3 days; pt states that her symptoms have been getting progressively worse over the last 3days; pt reports productive cough with yellow mucous; pt c/o soreness to chest and rib area from coughing

## 2016-02-08 ENCOUNTER — Emergency Department (HOSPITAL_COMMUNITY): Payer: Commercial Managed Care - HMO

## 2016-02-08 ENCOUNTER — Encounter (HOSPITAL_COMMUNITY): Payer: Self-pay | Admitting: Emergency Medicine

## 2016-02-08 ENCOUNTER — Observation Stay (HOSPITAL_COMMUNITY)
Admission: EM | Admit: 2016-02-08 | Discharge: 2016-02-09 | Disposition: A | Discharge status: Home / Self Care | Payer: Commercial Managed Care - HMO | Attending: Family Medicine | Admitting: Family Medicine

## 2016-02-08 DIAGNOSIS — R079 Chest pain, unspecified: Secondary | ICD-10-CM | POA: Diagnosis present

## 2016-02-08 DIAGNOSIS — E663 Overweight: Secondary | ICD-10-CM | POA: Insufficient documentation

## 2016-02-08 DIAGNOSIS — Z7982 Long term (current) use of aspirin: Secondary | ICD-10-CM | POA: Insufficient documentation

## 2016-02-08 DIAGNOSIS — Z6841 Body Mass Index (BMI) 40.0 and over, adult: Secondary | ICD-10-CM | POA: Diagnosis not present

## 2016-02-08 DIAGNOSIS — I10 Essential (primary) hypertension: Secondary | ICD-10-CM | POA: Diagnosis present

## 2016-02-08 DIAGNOSIS — R0602 Shortness of breath: Secondary | ICD-10-CM | POA: Insufficient documentation

## 2016-02-08 DIAGNOSIS — Z79899 Other long term (current) drug therapy: Secondary | ICD-10-CM | POA: Diagnosis not present

## 2016-02-08 DIAGNOSIS — R0789 Other chest pain: Principal | ICD-10-CM | POA: Insufficient documentation

## 2016-02-08 LAB — BASIC METABOLIC PANEL
Anion gap: 9 (ref 5–15)
BUN: 19 mg/dL (ref 6–20)
CALCIUM: 9.5 mg/dL (ref 8.9–10.3)
CHLORIDE: 107 mmol/L (ref 101–111)
CO2: 28 mmol/L (ref 22–32)
Creatinine, Ser: 1 mg/dL (ref 0.44–1.00)
GFR calc non Af Amer: >60 mL/min (ref >60)
Glucose, Bld: 116 mg/dL — ABNORMAL HIGH (ref 65–99)
Potassium: 3.7 mmol/L (ref 3.5–5.1)
SODIUM: 144 mmol/L (ref 135–145)

## 2016-02-08 LAB — CBC
HCT: 34.7 % — ABNORMAL LOW (ref 36.0–46.0)
Hemoglobin: 10.9 g/dL — ABNORMAL LOW (ref 12.0–15.0)
MCH: 23.5 pg — ABNORMAL LOW (ref 26.0–34.0)
MCHC: 31.4 g/dL (ref 30.0–36.0)
MCV: 74.8 fL — ABNORMAL LOW (ref 78.0–100.0)
PLATELETS: 268 10*3/uL (ref 150–400)
RBC: 4.64 MIL/uL (ref 3.87–5.11)
RDW: 17.1 % — ABNORMAL HIGH (ref 11.5–15.5)
WBC: 9.3 10*3/uL (ref 4.0–10.5)

## 2016-02-08 LAB — I-STAT BETA HCG BLOOD, ED (MC, WL, AP ONLY)

## 2016-02-08 LAB — I-STAT TROPONIN, ED: TROPONIN I, POC: 0 ng/mL (ref 0.00–0.08)

## 2016-02-08 LAB — D-DIMER, QUANTITATIVE (NOT AT ARMC): D DIMER QUANT: 0.61 ug{FEU}/mL — AB (ref 0.00–0.50)

## 2016-02-08 MED ORDER — MORPHINE SULFATE (PF) 4 MG/ML IV SOLN
4.0000 mg | Freq: Once | INTRAVENOUS | Status: AC
  Administered 2016-02-08: 4 mg via INTRAVENOUS
  Filled 2016-02-08: qty 1

## 2016-02-08 NOTE — ED Notes (Signed)
Pt states she started having chest pain this morning and then it got better  Pt states the pain started again this evening and has stayed pretty constant  Pt states pain radiates to her back and makes it hard to breathe

## 2016-02-08 NOTE — ED Provider Notes (Signed)
CSN: [id number]     Arrival date & time 02/08/16  2134 History   None    Chief Complaint  Patient presents with  . Chest Pain    HPI   Kristine Kennedy is a 55 y.o. female with a PMH of HTN who presents to the ED with chest pain, she states started this morning, initially resolved, however current 8 PM. She has her symptoms have been constant since that time. She reports deep inspiration exacerbates her pain. She has not tried anything for symptom relief. She notes associated shortness of breath. She denies fever, chills, cough, congestion, abdominal pain, nausea, vomiting, numbness, weakness, paresthesia, lower extremity edema. She reports she traveled to visit a family member was in the car for 6 hours over the weekend. She denies recent surgery, history of malignancy, history of DVT or PE, estrogen use.   Past Medical History  Diagnosis Date  . Hypertension    Past Surgical History  Procedure Laterality Date  . Tubal ligation     Family History  Problem Relation Age of Onset  . Cancer Father     prostate  . Early death Father   . Cancer Daughter     ovarian  . Asthma Son   . Hypertension Mother    Social History  Substance Use Topics  . Smoking status: Never Smoker   . Smokeless tobacco: Never Used  . Alcohol Use: Yes     Comment: rarely   OB History    Gravida Para Term Preterm AB TAB SAB Ectopic Multiple Living   0 0 0 0 0 0 5      Review of Systems  Constitutional: Negative for fever and chills.  HENT: Negative for congestion.   Respiratory: Positive for shortness of breath. Negative for cough.   Cardiovascular: Positive for chest pain. Negative for leg swelling.  Gastrointestinal: Negative for nausea, vomiting and abdominal pain.  Neurological: Negative for dizziness, weakness, light-headedness and numbness.  All other systems reviewed and are negative.     Allergies  Bee venom  Home Medications   Prior to Admission medications    Medication Sig Start Date End Date Taking? Authorizing Provider  hydrochlorothiazide (HYDRODIURIL) 50 MG tablet Take 1 tablet (50 mg total) by mouth daily. 12/05/14  Yes Abram Sander, MD  Aspirin-Acetaminophen (GOODY BODY PAIN) 500-325 MG PACK Take 1 Package by mouth as needed. Patient not taking: Reported on 03/15/2015 11/29/14   Abram Sander, MD  chlorpheniramine-HYDROcodone [facility]) 10-8 MG/5ML [facility] Take 5 mLs by mouth every 12 (twelve) hours as needed for cough. Patient not taking: Reported on 02/08/2016 03/15/15   Elpidio Anis, PA-C    BP 119/74 mmHg  Pulse 85  Temp(Src) 97.8 F (36.6 C) (Oral)  Resp 19  SpO2 99% Physical Exam  Constitutional: She is oriented to person, place, and time. She appears well-developed and well-nourished. She appears distressed.  Patient appears uncomfortable due to pain.  HENT:  Head: Normocephalic and atraumatic.  Right Ear: External ear normal.  Left Ear: External ear normal.  Nose: Nose normal.  Mouth/Throat: Uvula is midline, oropharynx is clear and moist and mucous membranes are normal.  Eyes: Conjunctivae, EOM and lids are normal. Pupils are equal, round, and reactive to light. Right eye exhibits no discharge. Left eye exhibits no discharge. No scleral icterus.  Neck: Normal range of motion. Neck supple.  Cardiovascular: Normal rate, regular rhythm, normal heart sounds, intact distal pulses and normal pulses.  Pulmonary/Chest: Effort normal and breath sounds normal. No respiratory distress. She has no wheezes. She has no rales. She exhibits no tenderness.  Abdominal: Soft. Normal appearance and bowel sounds are normal. She exhibits no distension and no mass. There is no tenderness. There is no rigidity, no rebound and no guarding.  Musculoskeletal: Normal range of motion. She exhibits no edema or tenderness.  Neurological: She is alert and oriented to person, place, and time. She has normal strength. No cranial nerve deficit or  sensory deficit.  Skin: Skin is warm, dry and intact. No rash noted. She is not diaphoretic. No erythema. No pallor.  Psychiatric: She has a normal mood and affect. Her speech is normal and behavior is normal.  Nursing note and vitals reviewed.   ED Course  Procedures (including critical care time)  Labs Review Labs Reviewed  BASIC METABOLIC PANEL - Abnormal; Notable for the following:    Glucose, Bld 116 (*)    All other components within normal limits  CBC - Abnormal; Notable for the following:    Hemoglobin 10.9 (*)    HCT 34.7 (*)    MCV 74.8 (*)    MCH 23.5 (*)    RDW 17.1 (*)    All other components within normal limits  D-DIMER, QUANTITATIVE (NOT AT [facility]) - Abnormal; Notable for the following:    D-Dimer, Quant 0.61 (*)    All other components within normal limits  BRAIN NATRIURETIC PEPTIDE  I-STAT TROPOININ, ED  I-STAT BETA HCG BLOOD, ED ([facility], [facility], [facility] ONLY)    Imaging Review Dg Chest 2 View  02/08/2016  CLINICAL DATA:  55 year old female with chest pain EXAM: CHEST  2 VIEW COMPARISON:  Radiograph dated 03/15/2015 FINDINGS: Two views of the chest do not demonstrate a focal consolidation. There is no pleural effusion or pneumothorax. Minimal bibasilar dependent atelectatic changes noted. The cardiac silhouette is within normal limits. The osseous structures appear unremarkable. IMPRESSION: No active cardiopulmonary disease. Electronically Signed   By: Elgie Collard M.D.   On: 02/08/2016 22:15   Ct Angio Chest Pe W/cm &/or Wo Cm  02/09/2016  CLINICAL DATA:  55 year old female with chest pain and shortness of breath. Elevated D-dimer. EXAM: CT ANGIOGRAPHY CHEST WITH CONTRAST TECHNIQUE: Multidetector CT imaging of the chest was performed using the standard protocol during bolus administration of intravenous contrast. Multiplanar CT image reconstructions and MIPs were obtained to evaluate the vascular anatomy. CONTRAST:  OMNIPAQUE IOHEXOL 350 MG/ML SOLN COMPARISON:   Radiograph 02/08/2016 FINDINGS: There is mild diffuse ground-glass airspace densities at the lung bases with bibasilar interstitial prominence, likely representing a degree of congestive changes or edema. Pneumonia is not excluded. There is no focal consolidation or pneumothorax. Trace right pleural effusion noted. The central airways are patent. The thoracic aorta appears unremarkable. No CT evidence of pulmonary embolism. There is moderate cardiomegaly. Small pericardial effusion. Echocardiogram may provide better evaluation of the heart and cardiac function. There is no hilar or mediastinal adenopathy. The esophagus is grossly unremarkable. No thyroid nodules identified. There is no axillary adenopathy. The chest wall soft tissues appear unremarkable. The osseous structures are intact. The visualized upper abdomen is grossly unremarkable. Review of the MIP images confirms the above findings. IMPRESSION: No CT evidence of pulmonary embolism. Cardiomegaly with findings concerning for mild congestive changes. Pneumonia not excluded. Correlation with clinical exam recommended. Small pericardial effusion. Electronically Signed   By: Elgie Collard M.D.   On: 02/09/2016 01:23   I have personally reviewed and evaluated  these images and lab results as part of my medical decision-making.   EKG Interpretation   Date/Time:  Thursday February 08 2016 21:41:09 EST Ventricular Rate:  92 PR Interval:  158 QRS Duration: 87 QT Interval:  381 QTC Calculation: 471 R Axis:   69 Text Interpretation:  Sinus rhythm Confirmed by [facility]  MD, APRIL  ([id number]) on 02/09/2016 1:47:58 AM        MDM   Final diagnoses:  Chest pain, unspecified chest pain type    55 year old female presents with chest pain, which she states started today. Also notes shortness of breath.   Patient is afebrile. Heart rate initially 103. Heart regular rhythm. Lungs clear to auscultation bilaterally. Normal neuro exam with no  deficit.   EKG sinus rhythm, heart rate 92. Troponin negative.  CBC remarkable for hemoglobin 10.9. BMP unremarkable. Beta hCG negative. Chest x-ray no active cardiopulmonary disease.   Will obtain d-dimer given recent travel. D-dimer mildly elevated at 0.61. CT angio negative for PE, remarkable for cardiomegaly with findings concerning for mild congestive changes, pneumonia not excluded, small pericardial effusion. Feel pneumonia is less likely clinically (no cough, no fever). BNP pending.   Discussed findings with patient. Will give IV lasix. Family medicine consulted for admission. Spoke with family medicine resident, patient to be admitted to Dr. Leveda Anna for further evaluation and management. Patient discussed with Dr. Nicanor Alcon.  BP 119/74 mmHg  Pulse 85  Temp(Src) 97.8 F (36.6 C) (Oral)  Resp 19  SpO2 99%     Mady Gemma, PA-C 02/09/16 0153  April Palumbo, MD 03/06/16 0008

## 2016-02-09 ENCOUNTER — Observation Stay (HOSPITAL_BASED_OUTPATIENT_CLINIC_OR_DEPARTMENT_OTHER): Payer: Commercial Managed Care - HMO

## 2016-02-09 ENCOUNTER — Encounter (HOSPITAL_COMMUNITY): Payer: Self-pay

## 2016-02-09 ENCOUNTER — Emergency Department (HOSPITAL_COMMUNITY): Payer: Commercial Managed Care - HMO

## 2016-02-09 DIAGNOSIS — I517 Cardiomegaly: Secondary | ICD-10-CM | POA: Diagnosis not present

## 2016-02-09 DIAGNOSIS — Z7982 Long term (current) use of aspirin: Secondary | ICD-10-CM | POA: Diagnosis not present

## 2016-02-09 DIAGNOSIS — R079 Chest pain, unspecified: Secondary | ICD-10-CM | POA: Insufficient documentation

## 2016-02-09 DIAGNOSIS — I10 Essential (primary) hypertension: Secondary | ICD-10-CM

## 2016-02-09 DIAGNOSIS — R071 Chest pain on breathing: Secondary | ICD-10-CM | POA: Diagnosis not present

## 2016-02-09 DIAGNOSIS — R0602 Shortness of breath: Secondary | ICD-10-CM | POA: Diagnosis not present

## 2016-02-09 DIAGNOSIS — R0789 Other chest pain: Secondary | ICD-10-CM | POA: Diagnosis not present

## 2016-02-09 LAB — TROPONIN I

## 2016-02-09 LAB — LIPID PANEL
CHOLESTEROL: 160 mg/dL (ref 0–200)
HDL: 59 mg/dL (ref >40)
LDL Cholesterol: 94 mg/dL (ref 0–99)
TRIGLYCERIDES: 33 mg/dL (ref <150)
Total CHOL/HDL Ratio: 2.7 RATIO
VLDL: 7 mg/dL (ref 0–40)

## 2016-02-09 LAB — TSH: TSH: 2.967 u[IU]/mL (ref 0.350–4.500)

## 2016-02-09 LAB — BRAIN NATRIURETIC PEPTIDE: B Natriuretic Peptide: 30.2 pg/mL (ref 0.0–100.0)

## 2016-02-09 MED ORDER — ASPIRIN EC 81 MG PO TBEC: 81.0000 mg | DELAYED_RELEASE_TABLET | Freq: Every day | ORAL | Status: AC

## 2016-02-09 MED ORDER — ONDANSETRON HCL 4 MG/2ML IJ SOLN: 4.0000 mg | Freq: Four times a day (QID) | INTRAMUSCULAR | Status: DC | PRN

## 2016-02-09 MED ORDER — MORPHINE SULFATE (PF) 2 MG/ML IV SOLN: 2.0000 mg | INTRAVENOUS | Status: DC | PRN

## 2016-02-09 MED ORDER — IOHEXOL 350 MG/ML SOLN
100.0000 mL | Freq: Once | INTRAVENOUS | Status: AC | PRN
  Administered 2016-02-09: 100 mL via INTRAVENOUS

## 2016-02-09 MED ORDER — FUROSEMIDE 10 MG/ML IJ SOLN
40.0000 mg | Freq: Once | INTRAMUSCULAR | Status: AC
  Administered 2016-02-09: 40 mg via INTRAVENOUS
  Filled 2016-02-09: qty 4

## 2016-02-09 MED ORDER — ENOXAPARIN SODIUM 40 MG/0.4ML ~~LOC~~ SOLN
40.0000 mg | SUBCUTANEOUS | Status: DC
  Administered 2016-02-09: 40 mg via SUBCUTANEOUS
  Filled 2016-02-09: qty 0.4

## 2016-02-09 MED ORDER — HYDROCHLOROTHIAZIDE 25 MG PO TABS
50.0000 mg | ORAL_TABLET | Freq: Every day | ORAL | Status: DC
  Administered 2016-02-09: 50 mg via ORAL
  Filled 2016-02-09: qty 2

## 2016-02-09 MED ORDER — IBUPROFEN 600 MG PO TABS: 600.0000 mg | ORAL_TABLET | Freq: Four times a day (QID) | ORAL | Status: DC | PRN

## 2016-02-09 MED ORDER — ACETAMINOPHEN 325 MG PO TABS
650.0000 mg | ORAL_TABLET | ORAL | Status: DC | PRN
Start: 2016-02-09 — End: 2016-02-09

## 2016-02-09 NOTE — Progress Notes (Signed)
Discharge education and follow up appointments reviewed. No questions or concerns at this time.

## 2016-02-09 NOTE — Discharge Instructions (Signed)
You were admitted for chest pain. Labs and imaging showed no concerns. The pain may be due to a condition called costochondritis, or inflammation of the chest wall.   You can continue to take ibuprofen as needed for chest pain.   Please begin taking aspirin 81 mg daily after discharge.

## 2016-02-09 NOTE — H&P (Signed)
Family Medicine Teaching [facility] Admission History and Physical Service Pager: [phone]  Patient name: Kristine Kennedy Medical record number: [id number] Date of birth: [birth year] Age: 55 y.o. Gender: female  Primary Care Provider: Beverely Low, MD Consultants: cardiology Code Status: FULL  Chief Complaint: chest pain  Assessment and Plan: Kristine Kennedy is a 55 y.o. female presenting with chest pain . PMH is significant for HTN.   1. Chest pain: costochondritis vs pericarditis vs PE/ACS. Work-up at OSH negative for PE and ACS. Improvement in symptoms when leaning forward consistent with pericarditis, however no abnormalities seen on EKG. TTP of chest wall suggest costochondritis. BNP and D-dimer both WNL. Initial troponin neg. HEART score of 2. CXR no acute abnormalities. CTA with no evidence of PE, but did show cardiomegaly with findings concerning for pulmonary edema. Euvolemic, no evidence of pneumonia, pneumothorax, aortic dissection, large pericardial effusion on advanced imaging. - Place in observation, attending Dr. Leveda Anna - Repeat troponins - AM EKG - TSH, lipid panel, A1C - Telemetry - Tylenol PRN pain - Consult cardiology, appreciate recs - Anticipate discharge later today - Trial NSAID for possible MSK etiology  2. HTN: normotensive in ED.  - Continue home HCTZ  FEN/GI: heart healthy diet Prophylaxis: Lovenox  Disposition: place in observation  History of Present Illness:  Kristine Kennedy is a 55 y.o. female presenting with chest pain.   The patient reports that on the morning of 2/9, she began having intermittent chest pain, with each episode lasting about 5-10 minutes. Patient was sitting when the episodes started. This resolved initially, then returned around 8 PM the same day. She describes the pain as a stabbing sensation on the left side of her chest radiating through to her back. Sitting still and putting pressure on the site of  pain improves the symptoms. Also had improvement when leaning forward. Increased pain with deep inspiration. Pain worsens with movement of L shoulder and L side of neck. No prior episodes of such pain.  Reports some nausea but says this has since resolved; denies vomiting. Endorses some lower extremity edema on day prior to admission, but says this occurs from time to time and has since resolved. She attributes this to sitting in the car for a long period of time after driving to [place] to visit her mother four days ago . One leg was not more swollen than the other, no leg pain. No personal or family history of blood clots. Denies PND, orthopnea, (sometimes even sleeps without pillows), wheezing, cough, diaphoresis, dyspnea.    In [facility] ED, patient received work-up for PE as well as ACS rule out. She was also given morphine, which improved her pain. Labs and imaging were mostly negative, however there was concern for pulmonary edema or pericardial effusion given cardiomegaly seen on CTA. Patient was subsequently transferred to [facility] for further work-up of chest pain.   Review Of Systems: Per HPI with the following additions: Denies cough, diarrhea, increased urinary frequency, abdominal pain, diaphoresis, HA, vision changes, changes in hearing. Otherwise the remainder of the systems were negative.  Patient Active Problem List   Diagnosis Date Noted  . Chest pain 02/09/2016  . Routine health maintenance 11/19/2013  . Keloid of skin 10/22/2013  . Essential hypertension, benign 10/22/2013   Past Medical History: Past Medical History  Diagnosis Date  . Hypertension    Past Surgical History: Past Surgical History  Procedure Laterality Date  . Tubal ligation     Social History: Social History  Substance Use  Topics  . Smoking status: Never Smoker   . Smokeless tobacco: Never Used  . Alcohol Use: Yes     Comment: rarely   Drinks a glass of wine "every now and then." No illicit  drugs.  Please also refer to relevant sections of EMR.  Family History: Family History  Problem Relation Age of Onset  . Cancer Father     prostate  . Early death Father   . Cancer Daughter     ovarian  . Asthma Son   . Hypertension Mother    Allergies and Medications: Allergies  Allergen Reactions  . Bee Venom    No current facility-administered medications on file prior to encounter.   Current Outpatient Prescriptions on File Prior to Encounter  Medication Sig Dispense Refill  . hydrochlorothiazide (HYDRODIURIL) 50 MG tablet Take 1 tablet (50 mg total) by mouth daily. 30 tablet 11  . Aspirin-Acetaminophen (GOODY BODY PAIN) 500-325 MG PACK Take 1 Package by mouth as needed. (Patient not taking: Reported on 03/15/2015) 40 each 3  . chlorpheniramine-HYDROcodone (TUSSIONEX PENNKINETIC ER) 10-8 MG/5ML LQCR Take 5 mLs by mouth every 12 (twelve) hours as needed for cough. (Patient not taking: Reported on 02/08/2016) 75 mL 0   Objective: BP 123/62 mmHg  Pulse 78  Temp(Src) 98 F (36.7 C) (Oral)  Resp 20  Ht 5\' 7"  (1.702 m)  Wt 255 lb 8 oz (115.894 kg)  BMI 40.01 kg/m2  SpO2 100% Exam: General: Obese, pleasant middle-aged woman laying in bed resting comfortably, in NAD Eyes: PERRLA, EOMI ENTM: Upper dentures only; MMM, no oropharyngeal erythema or exudate Neck: FROM, supple, no lymphadenopathy or thyromegaly Cardiovascular: RRR, no murmurs or rubs appreciated Respiratory: CTAB, good air movement throughout, normal work of breathing, no wheezes or rhonchi Abdomen: obese, soft, non-tender, non-distended, +BS MSK: TTP of chest well; no LE edema or tenderness. Left shoulder exam normal. Skin: no rashes or bruises noted Neuro: A&Ox3, no focal deficits Psych: Appropriate mood and affect   Labs and Imaging: CBC BMET   Recent Labs Lab 02/08/16 2224  WBC 9.3  HGB 10.9*  HCT 34.7*  PLT 268    Recent Labs Lab 02/08/16 2224  NA 144  K 3.7  CL 107  CO2 28  BUN 19   CREATININE 1.00  GLUCOSE 116*  CALCIUM 9.5     Dg Chest 2 View  02/08/2016  CLINICAL DATA:  54 year old female with chest pain EXAM: CHEST  2 VIEW COMPARISON:  Radiograph dated 03/15/2015 FINDINGS: Two views of the chest do not demonstrate a focal consolidation. There is no pleural effusion or pneumothorax. Minimal bibasilar dependent atelectatic changes noted. The cardiac silhouette is within normal limits. The osseous structures appear unremarkable. IMPRESSION: No active cardiopulmonary disease. Electronically Signed   By: Elgie Collard M.D.   On: 02/08/2016 22:15   Ct Angio Chest Pe W/cm &/or Wo Cm  02/09/2016  CLINICAL DATA:  55 year old female with chest pain and shortness of breath. Elevated D-dimer. EXAM: CT ANGIOGRAPHY CHEST WITH CONTRAST TECHNIQUE: Multidetector CT imaging of the chest was performed using the standard protocol during bolus administration of intravenous contrast. Multiplanar CT image reconstructions and MIPs were obtained to evaluate the vascular anatomy. CONTRAST:  OMNIPAQUE IOHEXOL 350 MG/ML SOLN COMPARISON:  Radiograph 02/08/2016 FINDINGS: There is mild diffuse ground-glass airspace densities at the lung bases with bibasilar interstitial prominence, likely representing a degree of congestive changes or edema. Pneumonia is not excluded. There is no focal consolidation or pneumothorax. Trace right pleural effusion  noted. The central airways are patent. The thoracic aorta appears unremarkable. No CT evidence of pulmonary embolism. There is moderate cardiomegaly. Small pericardial effusion. Echocardiogram may provide better evaluation of the heart and cardiac function. There is no hilar or mediastinal adenopathy. The esophagus is grossly unremarkable. No thyroid nodules identified. There is no axillary adenopathy. The chest wall soft tissues appear unremarkable. The osseous structures are intact. The visualized upper abdomen is grossly unremarkable. Review of the MIP images  confirms the above findings. IMPRESSION: No CT evidence of pulmonary embolism. Cardiomegaly with findings concerning for mild congestive changes. Pneumonia not excluded. Correlation with clinical exam recommended. Small pericardial effusion. Electronically Signed   By: Elgie Collard M.D.   On: 02/09/2016 01:23    Marquette Saa, MD 02/09/2016, 5:12 AM PGY-1, [facility] Family Medicine FPTS Intern pager: [phone], text pages welcome  I have seen and evaluated the patient with Dr. Natale Milch. I am in agreement with the note above in its revised form. My additions are in red.  Shaquile Lutze B. Jarvis Newcomer, MD, PGY-3 02/09/2016 5:51 AM

## 2016-02-09 NOTE — Consult Note (Signed)
Reason for Consult: Pleuritic chest pain  Requesting Physician: Dr. Leveda Anna  HPI: Kristine Kennedy is a 55 year old mildly overweight married African-American female who works as a Clinical biochemist and was admitted with atypical chest pain. She has no prior cardiac history. Her only risk factor is hypertension. She does not smoke nor is she diabetic. There is no family history. She developed atypical chest pain 2 days ago worse with inspiration. She was seen at [facility] and transferred to [facility]. A CT angiogram was negative for pulmonary embolus. Chest x-ray was unremarkable. EKG showed no acute changes. Cardiac enzymes were negative as well.   Problem List: Patient Active Problem List   Diagnosis Date Noted  . Chest pain 02/09/2016  . Pain in the chest   . Routine health maintenance 11/19/2013  . Keloid of skin 10/22/2013  . Essential hypertension, benign 10/22/2013    PMHx:  Past Medical History  Diagnosis Date  . Hypertension    Past Surgical History  Procedure Laterality Date  . Tubal ligation      FAMHx: Family History  Problem Relation Age of Onset  . Cancer Father     prostate  . Early death Father   . Cancer Daughter     ovarian  . Asthma Son   . Hypertension Mother     SOCHx:  reports that she has never smoked. She has never used smokeless tobacco. She reports that she drinks alcohol. She reports that she does not use illicit drugs.  ALLERGIES: Allergies  Allergen Reactions  . Bee Venom     ROS: Pertinent items are noted in HPI.  HOME MEDICATIONS: Prescriptions prior to admission  Medication Sig Dispense Refill Last Dose  . hydrochlorothiazide (HYDRODIURIL) 50 MG tablet Take 1 tablet (50 mg total) by mouth daily. 30 tablet 11 02/07/2016 at Unknown time  . Aspirin-Acetaminophen (GOODY BODY PAIN) 500-325 MG PACK Take 1 Package by mouth as needed. (Patient not taking: Reported on 03/15/2015) 40 each 3 Not Taking at Unknown time  . chlorpheniramine-HYDROcodone  (TUSSIONEX PENNKINETIC ER) 10-8 MG/5ML LQCR Take 5 mLs by mouth every 12 (twelve) hours as needed for cough. (Patient not taking: Reported on 02/08/2016) 75 mL 0     HOSPITAL MEDICATIONS: I have reviewed the patient's current medications.  VITALS: Blood pressure 100/55, pulse 78, temperature 98 F (36.7 C), temperature source Oral, resp. rate 20, height  (1.702 m), weight 255 lb 8 oz (115.894 kg), SpO2 100 %.  INPUT/OUTPUT   Total I/O In: 240 [P.O.:240] Out: 600 [Urine:600]    PHYSICAL EXAM: General appearance: alert and no distress Neck: no adenopathy, no carotid bruit, no JVD, supple, symmetrical, trachea midline and thyroid not enlarged, symmetric, no tenderness/mass/nodules Lungs: clear to auscultation bilaterally Heart: regular rate and rhythm, S1, S2 normal, no murmur, click, rub or gallop Extremities: extremities normal, atraumatic, no cyanosis or edema  LABS:  BMP  Recent Labs  03/15/15 0428 02/08/16 2224  NA 141 144  K 3.6 3.7  CL 109 107  CO2 28 28  GLUCOSE 91 116*  BUN 13 19  CREATININE 1.08 1.00  CALCIUM 8.4 9.5  GFRNONAA 58* >60  GFRAA 67* >60    CBC  Recent Labs Lab 02/08/16 2224  WBC 9.3  RBC 4.64  HGB 10.9*  HCT 34.7*  PLT 268  MCV 74.8*    HEMOGLOBIN A1C No results found for: HGBA1C, MPG  Cardiac Panel (last 3 results)  Recent Labs  02/09/16 0524 02/09/16 1045  TROPONINI <  0.03 <0.03    BNP (last 3 results) No results for input(s): PROBNP in the last 8760 hours.  TSH  Recent Labs  02/09/16 0524  TSH 2.967    CHOLESTEROL  Recent Labs  02/09/16 0524  CHOL 160    Hepatic Function Panel No results for input(s): PROT, ALBUMIN, AST, ALT, ALKPHOS, BILITOT, BILIDIR, IBILI in the last 8760 hours.  IMAGING: Dg Chest 2 View  02/08/2016  CLINICAL DATA:  55 year old female with chest pain EXAM: CHEST  2 VIEW COMPARISON:  Radiograph dated 03/15/2015 FINDINGS: Two views of the chest do not demonstrate a focal  consolidation. There is no pleural effusion or pneumothorax. Minimal bibasilar dependent atelectatic changes noted. The cardiac silhouette is within normal limits. The osseous structures appear unremarkable. IMPRESSION: No active cardiopulmonary disease. Electronically Signed   By: Elgie Collard M.D.   On: 02/08/2016 22:15   Ct Angio Chest Pe W/cm &/or Wo Cm  02/09/2016  CLINICAL DATA:  55 year old female with chest pain and shortness of breath. Elevated D-dimer. EXAM: CT ANGIOGRAPHY CHEST WITH CONTRAST TECHNIQUE: Multidetector CT imaging of the chest was performed using the standard protocol during bolus administration of intravenous contrast. Multiplanar CT image reconstructions and MIPs were obtained to evaluate the vascular anatomy. CONTRAST:  OMNIPAQUE IOHEXOL 350 MG/ML SOLN COMPARISON:  Radiograph 02/08/2016 FINDINGS: There is mild diffuse ground-glass airspace densities at the lung bases with bibasilar interstitial prominence, likely representing a degree of congestive changes or edema. Pneumonia is not excluded. There is no focal consolidation or pneumothorax. Trace right pleural effusion noted. The central airways are patent. The thoracic aorta appears unremarkable. No CT evidence of pulmonary embolism. There is moderate cardiomegaly. Small pericardial effusion. Echocardiogram may provide better evaluation of the heart and cardiac function. There is no hilar or mediastinal adenopathy. The esophagus is grossly unremarkable. No thyroid nodules identified. There is no axillary adenopathy. The chest wall soft tissues appear unremarkable. The osseous structures are intact. The visualized upper abdomen is grossly unremarkable. Review of the MIP images confirms the above findings. IMPRESSION: No CT evidence of pulmonary embolism. Cardiomegaly with findings concerning for mild congestive changes. Pneumonia not excluded. Correlation with clinical exam recommended. Small pericardial effusion.  Electronically Signed   By: Elgie Collard M.D.   On: 02/09/2016 01:23    Tele- NSR (I personally reviewed)  IMPRESSION: 1. Atypical chest pain-negative chest CT for primary embolus, normal twelve-lead EKG and negative enzymes. Her prior probability is low with her only risk factor being hypertension. Her pain is atypical and more likely pleuritic. A 2-D echo is pending. 2. hypertension-on hydrochlorothiazide at home, well controlled here in the hospital   RECOMMENDATION: 1. Agree with 2-D echocardiogram. If this is normal the patient can be discharged home. We can arrange outpatient GXT.  Time Spent Directly with Patient: 20 minutes  Nanetta Batty 02/09/2016, 12:28 PM

## 2016-02-09 NOTE — Progress Notes (Signed)
  Echocardiogram 2D Echocardiogram has been performed.  Delcie Roch 02/09/2016, 1:19 PM

## 2016-02-10 LAB — HEMOGLOBIN A1C
Hgb A1c MFr Bld: 6 % — ABNORMAL HIGH (ref 4.8–5.6)
Mean Plasma Glucose: 126 mg/dL

## 2016-02-12 NOTE — Discharge Summary (Signed)
Family Medicine Teaching [facility] Discharge Summary  Patient name: Kristine Kennedy Medical record number: [id number] Date of birth: 08-24-1961 Age: 55 y.o. Gender: female Date of Admission: 02/08/2016  Date of Discharge: 02/09/2016 Admitting Physician: Moses Manners, MD  Primary Care Provider: Beverely Low, MD Consultants: none  Indication for Hospitalization: chest pain  Discharge Diagnoses/Problem List:  Patient Active Problem List   Diagnosis Date Noted  . Chest pain 02/09/2016  . Pain in the chest   . Routine health maintenance 11/19/2013  . Keloid of skin 10/22/2013  . Essential hypertension, benign 10/22/2013     Disposition: home  Discharge Condition: improved  Discharge Exam:  General: Obese, pleasant middle-aged woman laying in bed resting comfortably, in NAD Eyes: PERRLA, EOMI ENTM: Upper dentures only; MMM, no oropharyngeal erythema or exudate Neck: FROM, supple, no lymphadenopathy or thyromegaly Cardiovascular: RRR, no murmurs or rubs appreciated Respiratory: CTAB, good air movement throughout, normal work of breathing, no wheezes or rhonchi Abdomen: obese, soft, non-tender, non-distended, +BS MSK: TTP of chest well; no LE edema or tenderness. Left shoulder exam normal. Skin: no rashes or bruises noted Neuro: A&Ox3, no focal deficits Psych: Appropriate mood and affect   Brief Hospital Course:  Patient presented as transfer from [facility] for chest pain. At [facility], BNP and D-dimer were both WNL, and troponins were neg. CXR and CTA showed no a normalities, but did show cardiomegaly. Out of concern for pericardial effusion, patient was transferred to [facility] for further work-up.   On exam, patient had reproducible pain with chest wall tenderness, so pain seemed more consistent with costochondritis rather than of cardiac etiology. Troponins were cycled and patient was monitored on telemetry, however no abnormalities were found. After pain improved, she was  determined stable for discharge home with NSAIDs for pain.   Issues for Follow Up:  None  Significant Procedures: none  Significant Labs and Imaging:   Recent Labs Lab 02/08/16 2224  WBC 9.3  HGB 10.9*  HCT 34.7*  PLT 268    Recent Labs Lab 02/08/16 2224  NA 144  K 3.7  CL 107  CO2 28  GLUCOSE 116*  BUN 19  CREATININE 1.00  CALCIUM 9.5    Results/Tests Pending at Time of Discharge: none  Discharge Medications:    Medication List    STOP taking these medications        Aspirin-Acetaminophen 500-325 MG Pack  Commonly known as:  GOODY BODY PAIN     chlorpheniramine-HYDROcodone 10-8 MG/5ML Lqcr  Commonly known as:  TUSSIONEX PENNKINETIC ER      TAKE these medications        aspirin EC 81 MG tablet  Take 1 tablet (81 mg total) by mouth daily.     hydrochlorothiazide 50 MG tablet  Commonly known as:  HYDRODIURIL  Take 1 tablet (50 mg total) by mouth daily.        Discharge Instructions: Please refer to Patient Instructions section of EMR for full details.  Patient was counseled important signs and symptoms that should prompt return to medical care, changes in medications, dietary instructions, activity restrictions, and follow up appointments.   Follow-Up Appointments: Follow-up Information    Follow up with Beverely Low, MD. Schedule an appointment as soon as possible for a visit in 1 week.   Specialty:  Family Medicine   Why:  For hospital follow-up; appointment scheduled for Thursday 02/15/16 3:15pm   Contact information:   [address] [place] [address] [phone]  Marquette Saa, MD 02/12/2016, 2:00 PM PGY-1, [facility] Family Medicine

## 2016-02-15 ENCOUNTER — Ambulatory Visit: Payer: Commercial Managed Care - HMO | Admitting: Family Medicine

## 2016-02-22 ENCOUNTER — Ambulatory Visit (INDEPENDENT_AMBULATORY_CARE_PROVIDER_SITE_OTHER): Payer: Commercial Managed Care - HMO | Admitting: Family Medicine

## 2016-02-22 ENCOUNTER — Encounter: Payer: Self-pay | Admitting: Family Medicine

## 2016-02-22 VITALS — BP 118/68 | HR 78 | Temp 98.3°F | Ht 67.0 in | Wt 249.0 lb

## 2016-02-22 DIAGNOSIS — R079 Chest pain, unspecified: Secondary | ICD-10-CM

## 2016-02-22 DIAGNOSIS — R7303 Prediabetes: Secondary | ICD-10-CM | POA: Insufficient documentation

## 2016-02-22 DIAGNOSIS — I10 Essential (primary) hypertension: Secondary | ICD-10-CM

## 2016-02-22 MED ORDER — HYDROCHLOROTHIAZIDE 50 MG PO TABS: 50.0000 mg | ORAL_TABLET | Freq: Every day | ORAL | Status: AC

## 2016-02-22 NOTE — Patient Instructions (Signed)
Health Maintenance, Female Adopting a healthy lifestyle and getting preventive care can go a long way to promote health and wellness. Talk with your health care provider about what schedule of regular examinations is right for you. This is a good chance for you to check in with your provider about disease prevention and staying healthy. In between checkups, there are plenty of things you can do on your own. Experts have done a lot of research about which lifestyle changes and preventive measures are most likely to keep you healthy. Ask your health care provider for more information. WEIGHT AND DIET  Eat a healthy diet  Be sure to include plenty of vegetables, fruits, low-fat dairy products, and lean protein.  Do not eat a lot of foods high in solid fats, added sugars, or salt.  Get regular exercise. This is one of the most important things you can do for your health.  Most adults should exercise for at least 150 minutes each week. The exercise should increase your heart rate and make you sweat (moderate-intensity exercise).  Most adults should also do strengthening exercises at least twice a week. This is in addition to the moderate-intensity exercise.  Maintain a healthy weight  Body mass index (BMI) is a measurement that can be used to identify possible weight problems. It estimates body fat based on height and weight. Your health care provider can help determine your BMI and help you achieve or maintain a healthy weight.  For females 20 years of age and older:   A BMI below 18.5 is considered underweight.  A BMI of 18.5 to 24.9 is normal.  A BMI of 25 to 29.9 is considered overweight.  A BMI of 30 and above is considered obese.  Watch levels of cholesterol and blood lipids  You should start having your blood tested for lipids and cholesterol at 55 years of age, then have this test every 5 years.  You may need to have your cholesterol levels checked more often if:  Your lipid  or cholesterol levels are high.  You are older than 55 years of age.  You are at high risk for heart disease.  CANCER SCREENING   Lung Cancer  Lung cancer screening is recommended for adults 55-80 years old who are at high risk for lung cancer because of a history of smoking.  A yearly low-dose CT scan of the lungs is recommended for people who:  Currently smoke.  Have quit within the past 15 years.  Have at least a 30-pack-year history of smoking. A pack year is smoking an average of one pack of cigarettes a day for 1 year.  Yearly screening should continue until it has been 15 years since you quit.  Yearly screening should stop if you develop a health problem that would prevent you from having lung cancer treatment.  Breast Cancer  Practice breast self-awareness. This means understanding how your breasts normally appear and feel.  It also means doing regular breast self-exams. Let your health care provider know about any changes, no matter how small.  If you are in your 20s or 30s, you should have a clinical breast exam (CBE) by a health care provider every 1-3 years as part of a regular health exam.  If you are 40 or older, have a CBE every year. Also consider having a breast X-ray (mammogram) every year.  If you have a family history of breast cancer, talk to your health care provider about genetic screening.  If you   are at high risk for breast cancer, talk to your health care provider about having an MRI and a mammogram every year.  Breast cancer gene (BRCA) assessment is recommended for women who have family members with BRCA-related cancers. BRCA-related cancers include:  Breast.  Ovarian.  Tubal.  Peritoneal cancers.  Results of the assessment will determine the need for genetic counseling and BRCA1 and BRCA2 testing. Cervical Cancer Your health care provider may recommend that you be screened regularly for cancer of the pelvic organs (ovaries, uterus, and  vagina). This screening involves a pelvic examination, including checking for microscopic changes to the surface of your cervix (Pap test). You may be encouraged to have this screening done every 3 years, beginning at age 21.  For women ages 30-65, health care providers may recommend pelvic exams and Pap testing every 3 years, or they may recommend the Pap and pelvic exam, combined with testing for human papilloma virus (HPV), every 5 years. Some types of HPV increase your risk of cervical cancer. Testing for HPV may also be done on women of any age with unclear Pap test results.  Other health care providers may not recommend any screening for nonpregnant women who are considered low risk for pelvic cancer and who do not have symptoms. Ask your health care provider if a screening pelvic exam is right for you.  If you have had past treatment for cervical cancer or a condition that could lead to cancer, you need Pap tests and screening for cancer for at least 20 years after your treatment. If Pap tests have been discontinued, your risk factors (such as having a new sexual partner) need to be reassessed to determine if screening should resume. Some women have medical problems that increase the chance of getting cervical cancer. In these cases, your health care provider may recommend more frequent screening and Pap tests. Colorectal Cancer  This type of cancer can be detected and often prevented.  Routine colorectal cancer screening usually begins at 55 years of age and continues through 55 years of age.  Your health care provider may recommend screening at an earlier age if you have risk factors for colon cancer.  Your health care provider may also recommend using home test kits to check for hidden blood in the stool.  A small camera at the end of a tube can be used to examine your colon directly (sigmoidoscopy or colonoscopy). This is done to check for the earliest forms of colorectal  cancer.  Routine screening usually begins at age 50.  Direct examination of the colon should be repeated every 5-10 years through 55 years of age. However, you may need to be screened more often if early forms of precancerous polyps or small growths are found. Skin Cancer  Check your skin from head to toe regularly.  Tell your health care provider about any new moles or changes in moles, especially if there is a change in a mole's shape or color.  Also tell your health care provider if you have a mole that is larger than the size of a pencil eraser.  Always use sunscreen. Apply sunscreen liberally and repeatedly throughout the day.  Protect yourself by wearing long sleeves, pants, a wide-brimmed hat, and sunglasses whenever you are outside. HEART DISEASE, DIABETES, AND HIGH BLOOD PRESSURE   High blood pressure causes heart disease and increases the risk of stroke. High blood pressure is more likely to develop in:  People who have blood pressure in the high end   of the normal range (130-139/85-89 mm Hg).  People who are overweight or obese.  People who are African American.  If you are 38-23 years of age, have your blood pressure checked every 3-5 years. If you are 61 years of age or older, have your blood pressure checked every year. You should have your blood pressure measured twice--once when you are at a hospital or clinic, and once when you are not at a hospital or clinic. Record the average of the two measurements. To check your blood pressure when you are not at a hospital or clinic, you can use:  An automated blood pressure machine at a pharmacy.  A home blood pressure monitor.  If you are between 45 years and 39 years old, ask your health care provider if you should take aspirin to prevent strokes.  Have regular diabetes screenings. This involves taking a blood sample to check your fasting blood sugar level.  If you are at a normal weight and have a low risk for diabetes,  have this test once every three years after 55 years of age.  If you are overweight and have a high risk for diabetes, consider being tested at a younger age or more often. PREVENTING INFECTION  Hepatitis B  If you have a higher risk for hepatitis B, you should be screened for this virus. You are considered at high risk for hepatitis B if:  You were born in a country where hepatitis B is common. Ask your health care provider which countries are considered high risk.  Your parents were born in a high-risk country, and you have not been immunized against hepatitis B (hepatitis B vaccine).  You have HIV or AIDS.  You use needles to inject street drugs.  You live with someone who has hepatitis B.  You have had sex with someone who has hepatitis B.  You get hemodialysis treatment.  You take certain medicines for conditions, including cancer, organ transplantation, and autoimmune conditions. Hepatitis C  Blood testing is recommended for:  Everyone born from [birth year] through 1965.  Anyone with known risk factors for hepatitis C. Sexually transmitted infections (STIs)  You should be screened for sexually transmitted infections (STIs) including gonorrhea and chlamydia if:  You are sexually active and are younger than 55 years of age.  You are older than 55 years of age and your health care provider tells you that you are at risk for this type of infection.  Your sexual activity has changed since you were last screened and you are at an increased risk for chlamydia or gonorrhea. Ask your health care provider if you are at risk.  If you do not have HIV, but are at risk, it may be recommended that you take a prescription medicine daily to prevent HIV infection. This is called pre-exposure prophylaxis (PrEP). You are considered at risk if:  You are sexually active and do not regularly use condoms or know the HIV status of your partner(s).  You take drugs by injection.  You are sexually  active with a partner who has HIV. Talk with your health care provider about whether you are at high risk of being infected with HIV. If you choose to begin PrEP, you should first be tested for HIV. You should then be tested every 3 months for as long as you are taking PrEP.  PREGNANCY   If you are premenopausal and you may become pregnant, ask your health care provider about preconception counseling.  If you may  become pregnant, take 400 to 800 micrograms (mcg) of folic acid every day.  If you want to prevent pregnancy, talk to your health care provider about birth control (contraception). OSTEOPOROSIS AND MENOPAUSE   Osteoporosis is a disease in which the bones lose minerals and strength with aging. This can result in serious bone fractures. Your risk for osteoporosis can be identified using a bone density scan.  If you are 61 years of age or older, or if you are at risk for osteoporosis and fractures, ask your health care provider if you should be screened.  Ask your health care provider whether you should take a calcium or vitamin D supplement to lower your risk for osteoporosis.  Menopause may have certain physical symptoms and risks.  Hormone replacement therapy may reduce some of these symptoms and risks. Talk to your health care provider about whether hormone replacement therapy is right for you.  HOME CARE INSTRUCTIONS   Schedule regular health, dental, and eye exams.  Stay current with your immunizations.   Do not use any tobacco products including cigarettes, chewing tobacco, or electronic cigarettes.  If you are pregnant, do not drink alcohol.  If you are breastfeeding, limit how much and how often you drink alcohol.  Limit alcohol intake to no more than 1 drink per day for nonpregnant women. One drink equals 12 ounces of beer, 5 ounces of wine, or 1 ounces of hard liquor.  Do not use street drugs.  Do not share needles.  Ask your health care provider for help if  you need support or information about quitting drugs.  Tell your health care provider if you often feel depressed.  Tell your health care provider if you have ever been abused or do not feel safe at home.   This information is not intended to replace advice given to you by your health care provider. Make sure you discuss any questions you have with your health care provider.   Document Released: 07/01/2011 Document Revised: 01/06/2015 Document Reviewed: 11/17/2013 Elsevier Interactive Patient Education [employer].

## 2016-02-22 NOTE — Assessment & Plan Note (Addendum)
Admitted, negative EKG, trops, reproduced with palpation, likely costochondritis, resolved completely - f/u if CP returns, especially if exertional - can take ibuprofen prn for rib pain - encouraged lifestyle modification to decrease cardiovascular risk and daily baby aspirin

## 2016-02-22 NOTE — Progress Notes (Signed)
Subjective:   Kristine Kennedy is a 55 y.o. female with a history of htn here for f/u recent hospitalization for chest pain  Patient reports chest pain was not exertional, not relieved by rest, not associated with dyspnea, nausea or sweating. Was worse with deep breath, certain movements of torso. Reproduced on palpation. Had negative EKGs and troponins and dx costochondritis. The pain has resolved completed, none since discharge. Not taking anything for it.  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 All other systems reviewed and are negative.   PMH, PSH, Medications, Allergies, and FmHx reviewed and updated in EMR.  Social History: never smoker  Objective:  BP 118/68 mmHg  Pulse 78  Temp(Src) 98.3 F (36.8 C) (Oral)  Ht  (1.702 m)  Wt 249 lb (112.946 kg)  BMI 38.99 kg/m2  Gen:  55 y.o. female in NAD HEENT: NCAT, MMM, EOMI, PERRL, anicteric sclerae CV: RRR, no MRG, no JVD Resp: Non-labored, CTAB, no wheezes noted Abd: Soft, NTND, BS present, no guarding or organomegaly Ext: WWP, no edema MSK: Full ROM, strength intact Neuro: Alert and oriented, speech normal      Chemistry      Component Value Date/Time   NA 144 02/08/2016 2224   K 3.7 02/08/2016 2224   CL 107 02/08/2016 2224   CO2 28 02/08/2016 2224   BUN 19 02/08/2016 2224   CREATININE 1.00 02/08/2016 2224   CREATININE 0.64 10/22/2013 1442        Component Value Date/Time   CALCIUM 9.5 02/08/2016 2224      Lab Results  Component Value Date   WBC 9.3 02/08/2016   HGB 10.9* 02/08/2016   HCT 34.7* 02/08/2016   MCV 74.8* 02/08/2016   PLT 268 02/08/2016   Lab Results  Component Value Date   TSH 2.967 02/09/2016   Lab Results  Component Value Date   HGBA1C 6.0* 02/09/2016   Assessment & Plan:     Kristine Kennedy is a 55 y.o. female here for f/u chest pain, prediabetes, htn  Essential hypertension, benign BP well controlled off meds today, pt reports taking it at work because it goes up when she is stressed out, does not wish to stop meds - continue hctz , if starts to feel light-headed or dizzy then stop - f/u in 6 months, sooner if any issues arise  Chest pain Admitted, negative EKG, trops, reproduced with palpation, likely costochondritis, resolved completely - f/u if CP returns, especially if exertional - can take ibuprofen prn for rib pain - encouraged lifestyle modification to decrease cardiovascular risk and daily baby aspirin  Prediabetes Counseled on lifestyle modifications, increasing activity, cutting sweets and increasing veggies, has been losing weight - positive reinforcement for changes she has already made - encourage to continue and build on these things to prevent long term health problems      Beverely Low, MD, MPH [facility]  PGY-3 02/22/2016 2:31 PM

## 2016-02-22 NOTE — Assessment & Plan Note (Signed)
Counseled on lifestyle modifications, increasing activity, cutting sweets and increasing veggies, has been losing weight - positive reinforcement for changes she has already made - encourage to continue and build on these things to prevent long term health problems

## 2016-02-22 NOTE — Assessment & Plan Note (Signed)
BP well controlled off meds today, pt reports taking it at work because it goes up when she is stressed out, does not wish to stop meds - continue hctz , if starts to feel light-headed or dizzy then stop - f/u in 6 months, sooner if any issues arise

## 2017-07-19 IMAGING — CT CT ANGIO CHEST
2 of 6 series · 19 of 36 positions shown · IV contrast ([ID] OMNI [id number])
Comparison: Radiograph 02/08/2016

CLINICAL DATA: 54-year-old female with chest pain and shortness of
breath. Elevated D-dimer.

EXAM:
CT ANGIOGRAPHY CHEST WITH CONTRAST
TECHNIQUE: Multidetector CT imaging of the chest was performed using the
standard protocol during bolus administration of intravenous
contrast. Multiplanar CT image reconstructions and MIPs were
obtained to evaluate the vascular anatomy.
CONTRAST:  100mL OMNIPAQUE IOHEXOL 350 MG/ML SOLN

[Series 7: thins for pacs · axial · 0.55mm/px · z∈[+1560,+1752]mm · 18 of 214 slices shown]
[im 11/214  lung]
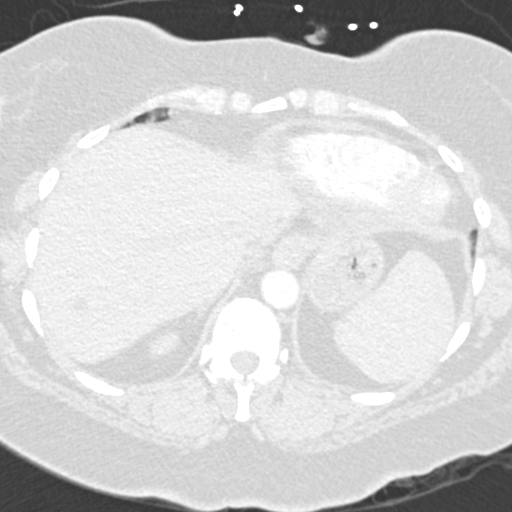
[im 22/214  mediastinal]
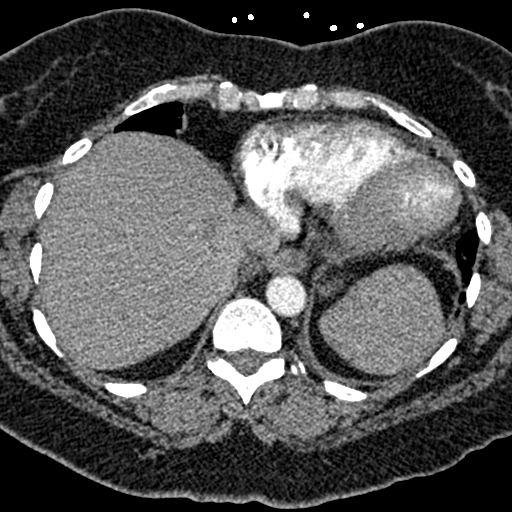
[im 32/214  lung]
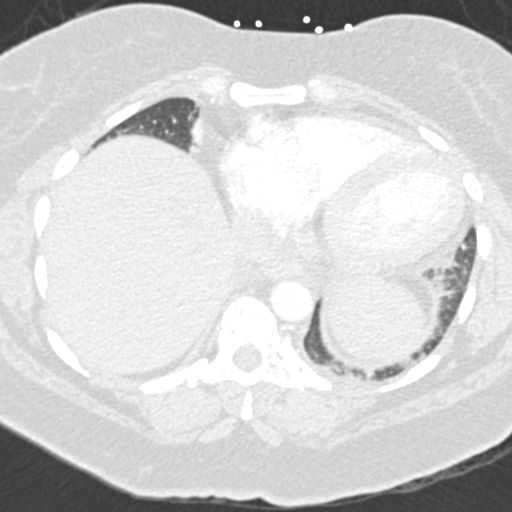
[im 43/214  mediastinal]
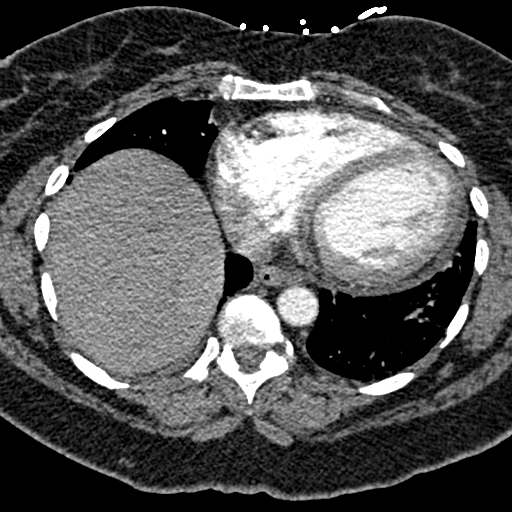
[im 54/214  lung]
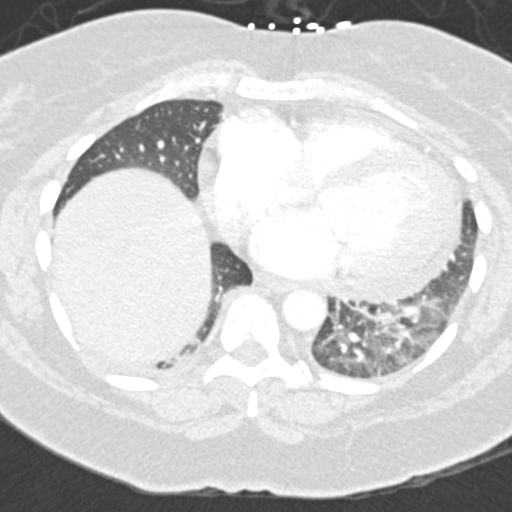
[im 64/214  mediastinal]
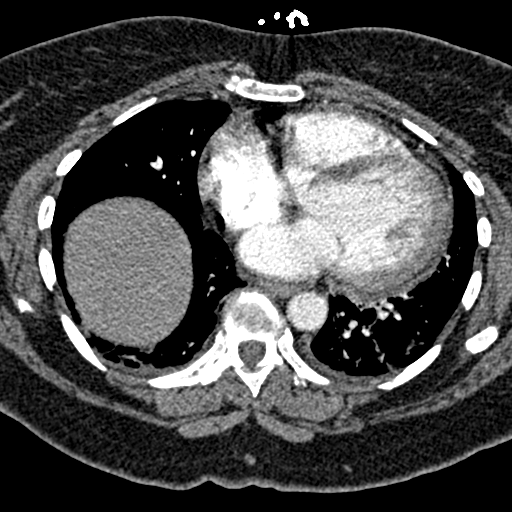
[im 75/214  lung]
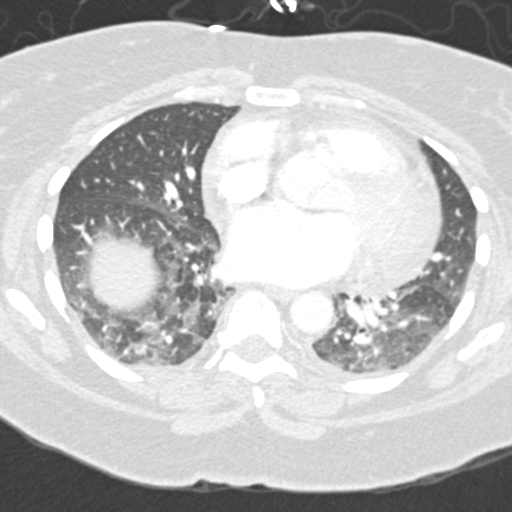
[im 86/214  mediastinal]
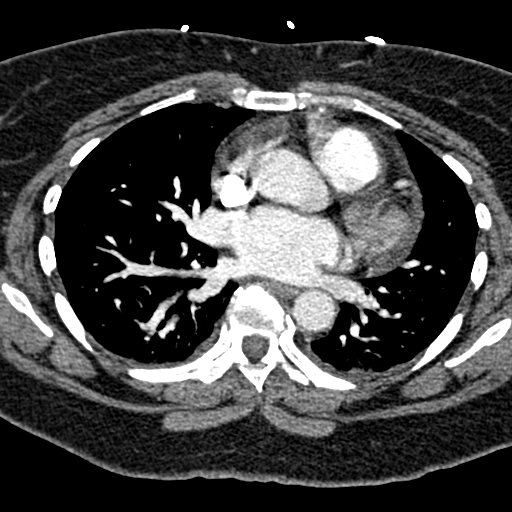
[im 96/214  lung]
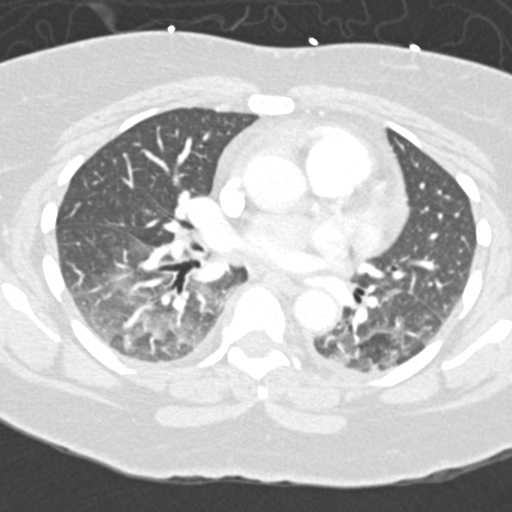
[im 118/214  mediastinal]
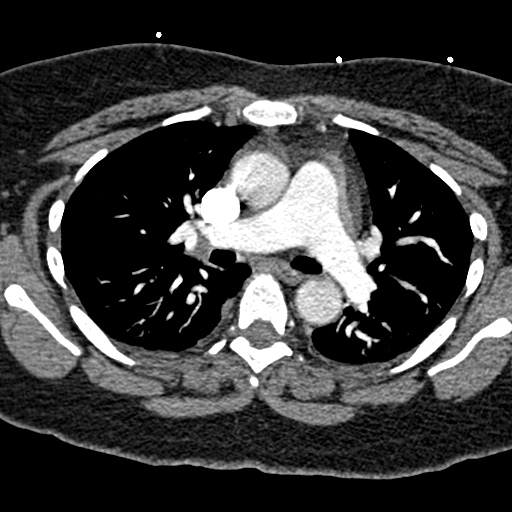
[im 128/214  lung]
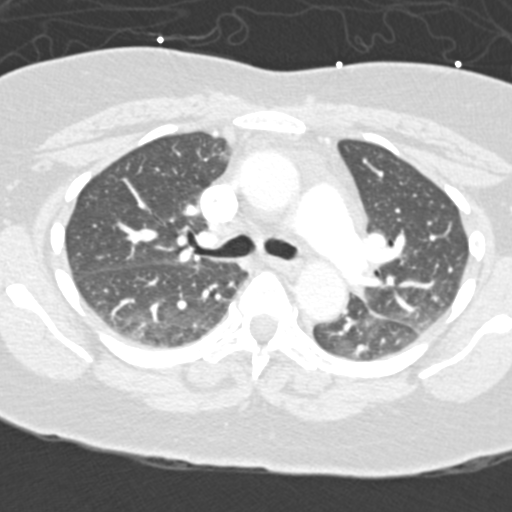
[im 139/214  mediastinal]
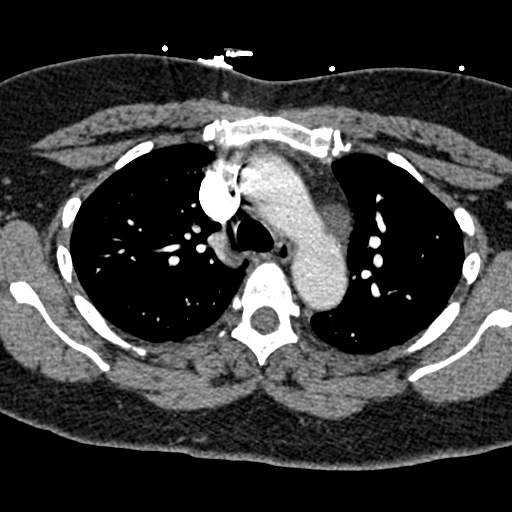
[im 150/214  lung]
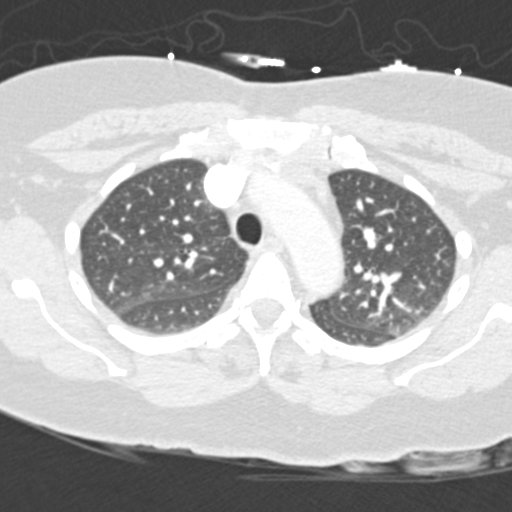
[im 160/214  mediastinal]
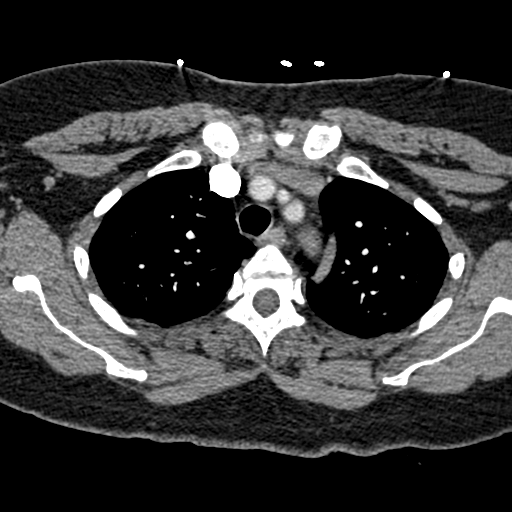
[im 171/214  lung]
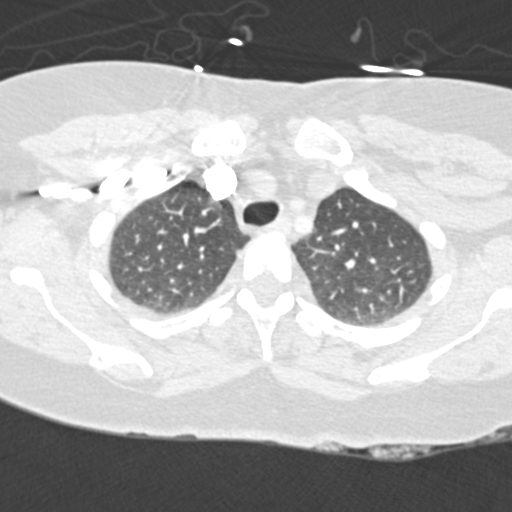
[im 182/214  mediastinal]
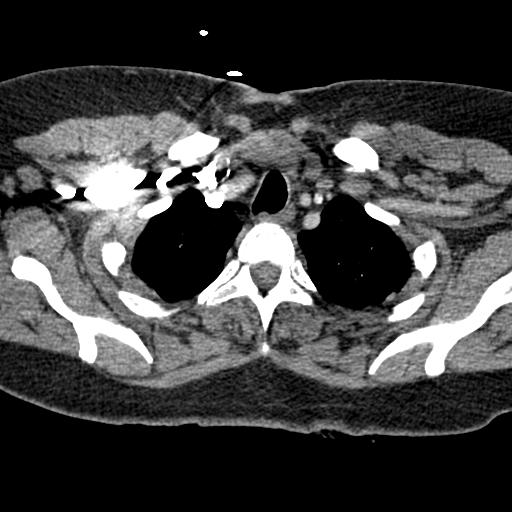
[im 192/214  lung]
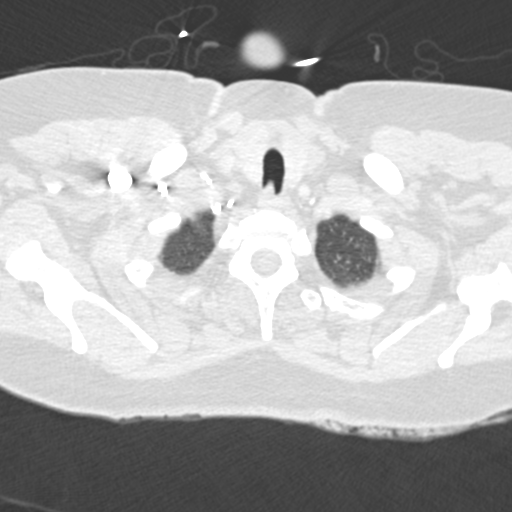
[im 203/214  mediastinal]
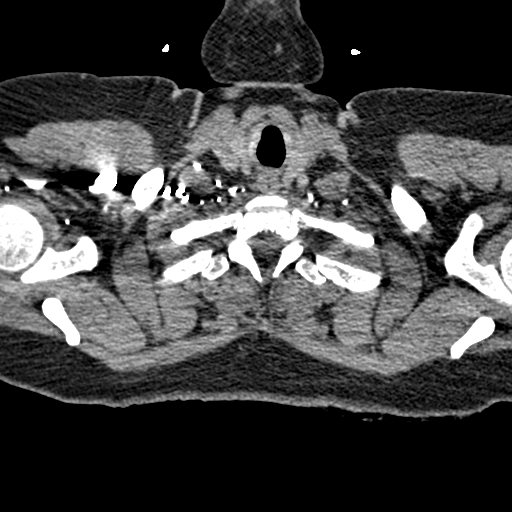

[Series 9: coronal mpr · coronal · 0.42mm/px · 1 of 107 slices shown]
[im 54/107  mediastinal]
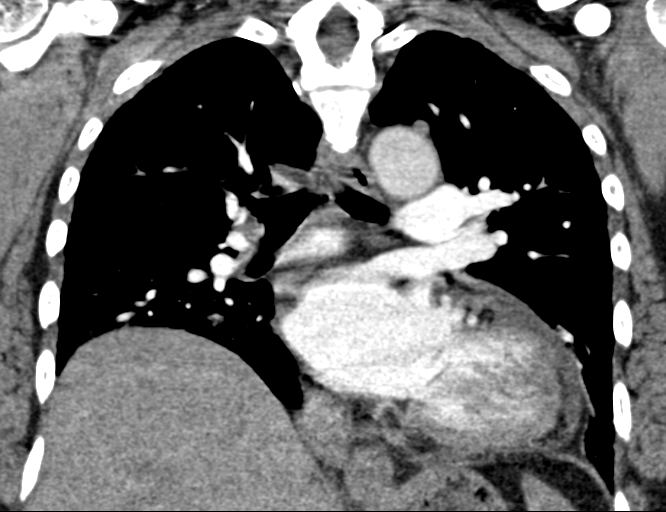

[19 of 36 positions shown; findings below may reference images not displayed]

FINDINGS: There is mild diffuse ground-glass airspace densities at the lung
bases with bibasilar interstitial prominence, likely representing a
degree of congestive changes or edema. Pneumonia is not excluded.
There is no focal consolidation or pneumothorax. Trace right pleural
effusion noted. The central airways are patent.

The thoracic aorta appears unremarkable. No CT evidence of pulmonary
embolism. There is moderate cardiomegaly. Small pericardial
effusion. Echocardiogram may provide better evaluation of the heart
and cardiac function. There is no hilar or mediastinal adenopathy.
The esophagus is grossly unremarkable. No thyroid nodules
identified.

There is no axillary adenopathy. The chest wall soft tissues appear
unremarkable. The osseous structures are intact.

The visualized upper abdomen is grossly unremarkable.

Review of the MIP images confirms the above findings.
IMPRESSION: No CT evidence of pulmonary embolism.

Cardiomegaly with findings concerning for mild congestive changes.
Pneumonia not excluded. Correlation with clinical exam recommended.

Small pericardial effusion.
